# Patient Record
Sex: Female | Born: 1970 | Hispanic: Yes | Marital: Single | Smoking: Never smoker
Health system: Southern US, Community
[De-identification: ages and names within clinical notes are randomized; demographics above are authoritative.]

## PROBLEM LIST (undated history)

## (undated) DIAGNOSIS — E785 Hyperlipidemia, unspecified: Secondary | ICD-10-CM

## (undated) DIAGNOSIS — A048 Other specified bacterial intestinal infections: Secondary | ICD-10-CM

## (undated) DIAGNOSIS — E119 Type 2 diabetes mellitus without complications: Secondary | ICD-10-CM

## (undated) HISTORY — DX: Hyperlipidemia, unspecified: E78.5

## (undated) HISTORY — PX: TUBAL LIGATION: SHX77

## (undated) HISTORY — DX: Type 2 diabetes mellitus without complications: E11.9

## (undated) HISTORY — PX: CHOLECYSTECTOMY: SHX55

## (undated) HISTORY — DX: Other specified bacterial intestinal infections: A04.8

---

## 2008-02-23 ENCOUNTER — Other Ambulatory Visit: Admission: RE | Admit: 2008-02-23 | Discharge: 2008-02-23 | Payer: Self-pay | Admitting: Family Medicine

## 2008-02-23 ENCOUNTER — Other Ambulatory Visit: Admission: RE | Admit: 2008-02-23 | Discharge: 2008-02-23 | Payer: Self-pay | Admitting: Nurse Practitioner

## 2008-06-13 ENCOUNTER — Ambulatory Visit (HOSPITAL_COMMUNITY): Admission: RE | Admit: 2008-06-13 | Discharge: 2008-06-13 | Payer: Self-pay | Admitting: Obstetrics and Gynecology

## 2010-07-12 LAB — CBC
HCT: 35.9 % — ABNORMAL LOW (ref 36.0–46.0)
Hemoglobin: 12.6 g/dL (ref 12.0–15.0)
MCHC: 35 g/dL (ref 30.0–36.0)
MCV: 87.6 fL (ref 78.0–100.0)
Platelets: 260 10*3/uL (ref 150–400)
RBC: 4.1 MIL/uL (ref 3.87–5.11)
RDW: 13.8 % (ref 11.5–15.5)
WBC: 7.7 10*3/uL (ref 4.0–10.5)

## 2010-07-12 LAB — HCG, QUANTITATIVE, PREGNANCY: hCG, Beta Chain, Quant, S: <2 m[IU]/mL (ref <5)

## 2010-08-14 NOTE — H&P (Signed)
NAMEDAJIAH, KOOI         ACCOUNT NO.:  [id number]   MEDICAL RECORD NO.:  [id number]          PATIENT TYPE:  AMB   LOCATION:  DAY                           FACILITY:  [facility]   PHYSICIAN:  Tilda Burrow, M.D. DATE OF BIRTH:  [birth year]   DATE OF ADMISSION:  DATE OF DISCHARGE:  LH                              HISTORY & PHYSICAL   ADMISSION DIAGNOSES:  1. Desire for elective sterilization.2.  Umbilical hernia.   HISTORY OF PRESENT ILLNESS:  This 40 year old Hispanic female was  referred to our office at the courtesy of the Health Department for  elective permanent sterilization, covered by the [facility] funded  sterilization title 10 funds.  The patient has acknowledged her desire  for permanent sterilization and has signed tubal sterilization forms on  April 25, 2008, at [facility] Department.  She has come  to our office for evaluation and scheduling of surgery.  She has  confirmed her desire for permanent sterilization.  She acknowledges the  understanding of the procedure.  We have reviewed a [employer] instructional  booklet to review the tubal sterilization procedure.  Failure rates of  1:100 are quoted to the patient.   PRIOR GYN HISTORY:  Notable for colposcopic abnormalities.  She had a  Pap smear suggesting atypical squamous cells, suggesting high-grade  lesion in February 2008, with subsequent erosive cervicitis.  She was  treated with for Trichomonas vaginitis and the cervix remained inflamed.  The colposcopic biopsy showed no dysplasia and only inflammation.  Currently the abnormal Pap smears are being followed every 6 months,  until normal results are obtained or abnormalities identified that would  be removed.   PAST MEDICAL HISTORY:  Benign.   SURGICAL HISTORY:  Cholecystectomy in 2001.   ALLERGIES:  DENIED.   MEDICATIONS:  Denied.   PHYSICAL EXAMINATION:  VITAL SIGNS:  Height 5'1, weight 285.  GENERAL:  Showing a healthy-appearing obese  Hispanic female, alert and  oriented x3.  HEENT:  Pupils equal, round and reactive.  NECK:  Supple.  CHEST:  Clear to auscultation.  BREASTS:  Deferred.  ABDOMEN:  Obese with a small 2-cm umbilical hernia, reducible.  External  genitalia is normal female.  Vaginal exam normal.  Cervix at this time  is nonpurulent.  Uterus is mobile and nontender.  Exam limited by the  patient's body habitus.  Adnexa without discernible pathology.   ASSESSMENT:  1. Desire for elective permanent sterilization.  2. Small umbilical hernia.  3. History of abnormal Pap smears, being followed by Health      Department.  4. Morbid obesity.   PLAN:  Laparoscopic tubal sterilization with Falope rings with an  umbilical hernia performed at the same time as a mechanism of accessing  the abdomen, as well as improving patient condition.      Tilda Burrow, M.D.  Electronically Signed     JVF/MEDQ  D:  06/09/2008  T:  06/09/2008  Job:  [id number]   cc:   Health Department [facility]

## 2010-08-14 NOTE — Op Note (Signed)
Angelica Bentley, MACPHERSON         ACCOUNT NO.:  [id number]   MEDICAL RECORD NO.:  [id number]          PATIENT TYPE:  AMB   LOCATION:  DAY                           FACILITY:  [facility]   PHYSICIAN:  Tilda Burrow, M.D. DATE OF BIRTH:  March 26, 1971   DATE OF PROCEDURE:  06/13/2008  DATE OF DISCHARGE:                               OPERATIVE REPORT   PREOPERATIVE DIAGNOSES:  1. Elective sterilization.2.  Morbid obesity.3. UmbilicaL Hernia   POSTOPERATIVE DIAGNOSES:  1. Elective sterilization.2.  Morbid obesity.3/ Umbilical Hernia.   PROCEDURE:  Laparoscopic tubal sterilization, Falope-Ring, and umbilical  herniorrhaphy.   INDICATIONS:  A 40 year old female referred from [facility] Department for tubal sterilization.  The patient has been  abstinent since LMP.   FINDINGS:  Small umbilical hernia at the center of  umbilicus, possibly  related to prior laparoscopic cholecystectomy.   DETAILS OF PROCEDURE:  The patient was taken to the operating room,  prepped and draped for combined abdominal and vaginal procedure.  In-and-  out [facility] catheter was performed and then the cervix was grasped with  single-tooth tenaculum.  Hulka tenaculum applied and utilized later for  uterine manipulation.  An infraumbilical 2 cm skin incision was made  sharply with Allis clamps placed on skin edges and the umbilical hernia  identified just beneath the skin.  The hernia sac was peeled free from  the surrounding tissue and then opened at its apex.  There was omental  fat only in the small hernia, less than 1 cm in diameter.  The 5-mm  laparoscopic trocar could be inserted directly without difficulty or  resistance and then pneumoperitoneum was easily achieved.  Allis clamp  was placed on the adjacent tissues to maintain pneumoperitoneum.  The  abdomen was inspected.  There was a small amount of omental adhesions to  the umbilical hernia site, but otherwise there were no anterior  abdominal wall adhesions.  There was extensive intra-abdominal fat  making visualization initially challenging.  The patient was placed in  significant Trendelenburg position and suprapubic trocar placed under  direct visualization.  Attention was directed to the pelvis and after  some effort in manipulating the epiploic fat away, we were able to  identify the tubes bilaterally.  Falope-Rings were placed on the  proximal portion of each tube with good tissue capture in the ring  applier.  The incarcerated knuckle of tube was infiltrated  percutaneously by use of a spinal needle, instilling a couple of  milliliters of Marcaine solution on either side of the Falope-Ring.  The  procedure was then continued by removal of the umbilical trocar.  The  camera was placed in the suprapubic trocar, and the underside of the  umbilical hernia inspected.  There were some thin adhesions of omental  tissues to the hernia sac.  There was no bowel anywhere near the  umbilical hernia.  The hernia sac could be trimmed and released into the  abdomen.  There was good hemostasis.  The fascia was closed with 3  interrupted 2-0 Prolene sutures, which were tied in an interrupted  fashion.  Subcu  tissues were approximated  with 2 subcu 3-0 Vicryl sutures and then  subcuticular closure of the skin at both umbilical and suprapubic sites  performed.  Steri-Strips were placed, Band-Aids covering the Steri-  Strips and the patient went to recovery room in stable condition.  Sponge and needle counts were correct.      Tilda Burrow, M.D.  Electronically Signed     JVF/MEDQ  D:  06/13/2008  T:  06/14/2008  Job:  [id number]   cc:   Health Department [facility]  Fax: [phone]

## 2012-04-01 DIAGNOSIS — E119 Type 2 diabetes mellitus without complications: Secondary | ICD-10-CM

## 2012-04-01 DIAGNOSIS — A048 Other specified bacterial intestinal infections: Secondary | ICD-10-CM

## 2012-04-01 HISTORY — DX: Type 2 diabetes mellitus without complications: E11.9

## 2012-04-01 HISTORY — DX: Other specified bacterial intestinal infections: A04.8

## 2012-10-20 ENCOUNTER — Other Ambulatory Visit (HOSPITAL_COMMUNITY): Payer: Self-pay | Admitting: Physician Assistant

## 2012-10-20 DIAGNOSIS — Z139 Encounter for screening, unspecified: Secondary | ICD-10-CM

## 2012-10-27 ENCOUNTER — Ambulatory Visit (HOSPITAL_COMMUNITY): Payer: Self-pay

## 2012-11-03 ENCOUNTER — Ambulatory Visit (HOSPITAL_COMMUNITY)
Admission: RE | Admit: 2012-11-03 | Discharge: 2012-11-03 | Disposition: A | Discharge status: Home / Self Care | Payer: Self-pay | Source: Ambulatory Visit | Attending: Physician Assistant | Admitting: Physician Assistant

## 2012-11-03 DIAGNOSIS — Z139 Encounter for screening, unspecified: Secondary | ICD-10-CM

## 2013-09-30 ENCOUNTER — Other Ambulatory Visit (HOSPITAL_COMMUNITY): Payer: Self-pay | Admitting: Physician Assistant

## 2013-09-30 DIAGNOSIS — Z1231 Encounter for screening mammogram for malignant neoplasm of breast: Secondary | ICD-10-CM

## 2013-11-04 ENCOUNTER — Encounter (HOSPITAL_COMMUNITY): Payer: Self-pay

## 2013-11-08 ENCOUNTER — Ambulatory Visit (HOSPITAL_COMMUNITY)
Admission: RE | Admit: 2013-11-08 | Discharge: 2013-11-08 | Disposition: A | Discharge status: Home / Self Care | Payer: PRIVATE HEALTH INSURANCE | Source: Ambulatory Visit | Attending: Physician Assistant | Admitting: Physician Assistant

## 2013-11-08 DIAGNOSIS — Z1231 Encounter for screening mammogram for malignant neoplasm of breast: Secondary | ICD-10-CM

## 2013-11-12 ENCOUNTER — Other Ambulatory Visit: Payer: Self-pay | Admitting: Physician Assistant

## 2013-11-12 DIAGNOSIS — R928 Other abnormal and inconclusive findings on diagnostic imaging of breast: Secondary | ICD-10-CM

## 2013-12-02 ENCOUNTER — Other Ambulatory Visit (HOSPITAL_COMMUNITY): Payer: Self-pay | Admitting: *Deleted

## 2013-12-02 DIAGNOSIS — R928 Other abnormal and inconclusive findings on diagnostic imaging of breast: Secondary | ICD-10-CM

## 2013-12-14 ENCOUNTER — Ambulatory Visit (HOSPITAL_COMMUNITY)
Admission: RE | Admit: 2013-12-14 | Discharge: 2013-12-14 | Disposition: A | Discharge status: Home / Self Care | Payer: PRIVATE HEALTH INSURANCE | Source: Ambulatory Visit | Attending: *Deleted | Admitting: *Deleted

## 2013-12-14 DIAGNOSIS — R928 Other abnormal and inconclusive findings on diagnostic imaging of breast: Secondary | ICD-10-CM | POA: Insufficient documentation

## 2015-01-18 ENCOUNTER — Other Ambulatory Visit: Payer: Self-pay | Admitting: Physician Assistant

## 2015-01-18 DIAGNOSIS — Z1231 Encounter for screening mammogram for malignant neoplasm of breast: Secondary | ICD-10-CM

## 2015-01-25 ENCOUNTER — Ambulatory Visit (HOSPITAL_COMMUNITY)
Admission: RE | Admit: 2015-01-25 | Discharge: 2015-01-25 | Disposition: A | Discharge status: Home / Self Care | Payer: PRIVATE HEALTH INSURANCE | Source: Ambulatory Visit | Attending: Physician Assistant | Admitting: Physician Assistant

## 2015-01-25 DIAGNOSIS — Z1231 Encounter for screening mammogram for malignant neoplasm of breast: Secondary | ICD-10-CM

## 2015-02-21 ENCOUNTER — Ambulatory Visit: Payer: Self-pay | Admitting: Physician Assistant

## 2015-02-21 ENCOUNTER — Encounter: Payer: Self-pay | Admitting: Physician Assistant

## 2015-02-21 VITALS — BP 138/80 | HR 93 | Temp 98.1°F | Ht 62.5 in | Wt 262.0 lb

## 2015-02-21 DIAGNOSIS — R300 Dysuria: Secondary | ICD-10-CM

## 2015-02-21 DIAGNOSIS — N3090 Cystitis, unspecified without hematuria: Secondary | ICD-10-CM

## 2015-02-21 LAB — POCT URINALYSIS DIPSTICK
Bilirubin, UA: NEGATIVE
Ketones, UA: NEGATIVE
LEUKOCYTES UA: NEGATIVE
Protein, UA: >=300
Spec Grav, UA: >=1.03
UROBILINOGEN UA: 1
pH, UA: 5.5

## 2015-02-21 MED ORDER — LISINOPRIL 5 MG PO TABS: 5.0000 mg | ORAL_TABLET | Freq: Every day | ORAL | Status: DC

## 2015-02-21 MED ORDER — CIPROFLOXACIN HCL 500 MG PO TABS: 500.0000 mg | ORAL_TABLET | Freq: Two times a day (BID) | ORAL | Status: DC

## 2015-02-21 NOTE — Progress Notes (Signed)
BP 138/80 mmHg  Pulse 93  Temp(Src) 98.1 F (36.7 C)  Ht 5' 2.5" (1.588 m)  Wt 262 lb (118.842 kg)  BMI 47.13 kg/m2  SpO2 99%  LMP 01/14/2015   Subjective:    Patient ID: Angelica Bentley, female    DOB: 05/21/1970, 44 y.o.   MRN: [id number]  HPI: Angelica Bentley is a 44 y.o. female presenting on 02/21/2015 for Dysuria   HPI  symtpoms started lasted Monday, eight days ago with frequency.  Burning started Friday.  She felt feverish Saturday and Sunday but she didn't actually check her temperature.    Relevant past medical, surgical, family and social history reviewed and updated as indicated. Interim medical history since our last visit reviewed. Allergies and medications reviewed and updated.  Current outpatient prescriptions:  .  fenofibrate micronized (LOFIBRA) 134 MG capsule, Take 134 mg by mouth daily., Disp: , Rfl:  .  glipiZIDE (GLUCOTROL) 10 MG tablet, Take 10 mg by mouth 2 (two) times daily., Disp: , Rfl:  .  insulin NPH-regular Human (NOVOLIN 70/30) (70-30) 100 UNIT/ML injection, Inject 30 Units into the skin 2 (two) times daily., Disp: , Rfl:  .  lisinopril (PRINIVIL,ZESTRIL) 5 MG tablet, Take 5 mg by mouth daily., Disp: , Rfl:  .  metFORMIN (GLUCOPHAGE) 1000 MG tablet, Take 1,000 mg by mouth 2 (two) times daily with a meal., Disp: , Rfl:  .  Omega-3 Fatty Acids (FISH OIL PO), Take by mouth. 4 cap daily, Disp: , Rfl:  .  simvastatin (ZOCOR) 20 MG tablet, Take 20 mg by mouth at bedtime., Disp: , Rfl:    Review of Systems  Constitutional: Positive for fever and chills. Negative for diaphoresis, appetite change, fatigue and unexpected weight change.  HENT: Positive for dental problem, hearing loss and mouth sores. Negative for congestion, drooling, ear pain, facial swelling, sneezing, sore throat, trouble swallowing and voice change.   Eyes: Negative for pain, discharge, redness, itching and visual disturbance.  Respiratory: Negative for cough, choking, shortness  of breath and wheezing.   Cardiovascular: Negative for chest pain, palpitations and leg swelling.  Gastrointestinal: Negative for vomiting, abdominal pain, diarrhea, constipation and blood in stool.  Endocrine: Negative for cold intolerance, heat intolerance and polydipsia.  Genitourinary: Positive for dysuria. Negative for hematuria and decreased urine volume.  Musculoskeletal: Positive for back pain. Negative for arthralgias and gait problem.  Skin: Negative for rash.  Allergic/Immunologic: Negative for environmental allergies.  Neurological: Positive for headaches. Negative for seizures, syncope and light-headedness.  Hematological: Negative for adenopathy.  Psychiatric/Behavioral: Negative for suicidal ideas, dysphoric mood and agitation. The patient is not nervous/anxious.     Per HPI unless specifically indicated above     Objective:    BP 138/80 mmHg  Pulse 93  Temp(Src) 98.1 F (36.7 C)  Ht 5' 2.5" (1.588 m)  Wt 262 lb (118.842 kg)  BMI 47.13 kg/m2  SpO2 99%  LMP 01/14/2015  Wt Readings from Last 3 Encounters:  02/21/15 262 lb (118.842 kg)    Physical Exam  Constitutional: She is oriented to person, place, and time. She appears well-developed and well-nourished.  HENT:  Head: Normocephalic and atraumatic.  Neck: Neck supple.  Cardiovascular: Normal rate and regular rhythm.   Pulmonary/Chest: Effort normal and breath sounds normal.  Abdominal: Soft. Bowel sounds are normal. She exhibits no mass. There is no tenderness. There is no CVA tenderness.  Lymphadenopathy:    She has no cervical adenopathy.  Neurological: She is alert and oriented to person,  place, and time.  Skin: Skin is warm and dry.  Psychiatric: She has a normal mood and affect. Her behavior is normal.  Vitals reviewed.   Results for orders placed or performed in visit on 02/21/15  POCT Urinalysis Dipstick  Result Value Ref Range   Color, UA orange    Clarity, UA cloudy    Glucose, UA >=1000     Bilirubin, UA N    Ketones, UA N    Spec Grav, UA >=1.030    Blood, UA SMALL    pH, UA 5.5    Protein, UA >=300    Urobilinogen, UA 1.0    Nitrite, UA P    Leukocytes, UA Negative Negative      Assessment & Plan:   Encounter Diagnoses  Name Primary?  . Dysuria Yes  . Cystitis      rx cipro F/u as scheduled. rto if symptoms worsen or persist

## 2015-02-22 ENCOUNTER — Other Ambulatory Visit: Payer: Self-pay | Admitting: Physician Assistant

## 2015-02-22 LAB — HEMOGLOBIN A1C
Hgb A1c MFr Bld: 9.8 % — ABNORMAL HIGH (ref <5.7)
MEAN PLASMA GLUCOSE: 235 mg/dL — AB (ref <117)

## 2015-02-28 ENCOUNTER — Encounter: Payer: Self-pay | Admitting: Physician Assistant

## 2015-02-28 ENCOUNTER — Ambulatory Visit: Payer: Self-pay | Admitting: Physician Assistant

## 2015-02-28 VITALS — BP 126/72 | HR 84 | Temp 97.7°F | Ht 62.5 in | Wt 262.2 lb

## 2015-02-28 DIAGNOSIS — E1165 Type 2 diabetes mellitus with hyperglycemia: Secondary | ICD-10-CM

## 2015-02-28 DIAGNOSIS — E785 Hyperlipidemia, unspecified: Secondary | ICD-10-CM

## 2015-02-28 DIAGNOSIS — E118 Type 2 diabetes mellitus with unspecified complications: Principal | ICD-10-CM

## 2015-02-28 MED ORDER — INSULIN NPH ISOPHANE & REGULAR (70-30) 100 UNIT/ML ~~LOC~~ SUSP: 33.0000 [IU] | Freq: Two times a day (BID) | SUBCUTANEOUS | Status: DC

## 2015-02-28 NOTE — Progress Notes (Signed)
BP 126/72 mmHg  Pulse 84  Temp(Src) 97.7 F (36.5 C)  Ht 5' 2.5" (1.588 m)  Wt 262 lb 3.2 oz (118.933 kg)  BMI 47.16 kg/m2  SpO2 98%   Subjective:    Patient ID: Angelica Bentley, female    DOB: 09-29-1970, 44 y.o.   MRN: [id number]  HPI: Angelica Bentley is a 44 y.o. female presenting on 02/28/2015 for Follow-up and Diabetes   HPI Pt has been checking her bs at home.  BS log reviewed- still too high At last regular OV, gave pt [employer] discount app so she could go to lipid cliic  Relevant past medical, surgical, family and social history reviewed and updated as indicated. Interim medical history since our last visit reviewed. Allergies and medications reviewed and updated.  Current outpatient prescriptions:  .  fenofibrate micronized (LOFIBRA) 134 MG capsule, Take 134 mg by mouth daily., Disp: , Rfl:  .  glipiZIDE (GLUCOTROL) 10 MG tablet, Take 10 mg by mouth 2 (two) times daily., Disp: , Rfl:  .  insulin NPH-regular Human (NOVOLIN 70/30) (70-30) 100 UNIT/ML injection, Inject 32 Units into the skin 2 (two) times daily. , Disp: , Rfl:  .  lisinopril (PRINIVIL,ZESTRIL) 5 MG tablet, Take 1 tablet (5 mg total) by mouth daily. Tome una tableta por boca diaria, Disp: 30 tablet, Rfl: 3 .  metFORMIN (GLUCOPHAGE) 1000 MG tablet, Take 1,000 mg by mouth 2 (two) times daily with a meal., Disp: , Rfl:  .  Omega-3 Fatty Acids (FISH OIL PO), Take by mouth. 4 cap daily, Disp: , Rfl:  .  simvastatin (ZOCOR) 20 MG tablet, Take 20 mg by mouth at bedtime., Disp: , Rfl:    Review of Systems  Constitutional: Negative for fever, chills, diaphoresis, appetite change, fatigue and unexpected weight change.  HENT: Positive for dental problem. Negative for congestion, drooling, ear pain, facial swelling, hearing loss, mouth sores, sneezing, sore throat, trouble swallowing and voice change.   Eyes: Negative for pain, discharge, redness, itching and visual disturbance.  Respiratory: Negative for cough,  choking, shortness of breath and wheezing.   Cardiovascular: Negative for chest pain, palpitations and leg swelling.  Gastrointestinal: Negative for vomiting, abdominal pain, diarrhea, constipation and blood in stool.  Endocrine: Negative for cold intolerance, heat intolerance and polydipsia.  Genitourinary: Negative for dysuria, hematuria and decreased urine volume.  Musculoskeletal: Negative for back pain, arthralgias and gait problem.  Skin: Negative for rash.  Allergic/Immunologic: Negative for environmental allergies.  Neurological: Negative for seizures, syncope, light-headedness and headaches.  Hematological: Negative for adenopathy.  Psychiatric/Behavioral: Negative for suicidal ideas, dysphoric mood and agitation. The patient is not nervous/anxious.     Per HPI unless specifically indicated above     Objective:    BP 126/72 mmHg  Pulse 84  Temp(Src) 97.7 F (36.5 C)  Ht 5' 2.5" (1.588 m)  Wt 262 lb 3.2 oz (118.933 kg)  BMI 47.16 kg/m2  SpO2 98%  Wt Readings from Last 3 Encounters:  02/28/15 262 lb 3.2 oz (118.933 kg)  02/21/15 262 lb (118.842 kg)    Physical Exam  Constitutional: She is oriented to person, place, and time. She appears well-developed and well-nourished.  HENT:  Head: Normocephalic and atraumatic.  Neck: Neck supple.  Cardiovascular: Normal rate and regular rhythm.   Pulmonary/Chest: Effort normal and breath sounds normal.  Abdominal: Soft. Bowel sounds are normal. She exhibits no mass. There is no tenderness.  obese  Musculoskeletal: She exhibits no edema.  Lymphadenopathy:    She  has no cervical adenopathy.  Neurological: She is alert and oriented to person, place, and time.  Skin: Skin is warm and dry.  Psychiatric: She has a normal mood and affect. Her behavior is normal.  Vitals reviewed.     Results for orders placed or performed in visit on 02/22/15  Hemoglobin A1c  Result Value Ref Range   Hgb A1c MFr Bld 9.8 (H) <5.7 %   Mean  Plasma Glucose 235 (H) <117 mg/dL      Assessment & Plan:   Encounter Diagnoses  Name Primary?  Marland Kitchen. Uncontrolled type 2 diabetes mellitus with complication, unspecified long term insulin use status (HCC) Yes  . Hyperlipemia   . Morbid obesity, unspecified obesity type (HCC)     -Re-refer to lipid clinic (last labs drawn 11/16/14) -Has  DM eye exam appt in juanuary -Increase insulin 1 u qod if am fbs > 120 -F/u with bs log in one month

## 2015-03-02 DIAGNOSIS — E785 Hyperlipidemia, unspecified: Secondary | ICD-10-CM | POA: Insufficient documentation

## 2015-03-02 DIAGNOSIS — E118 Type 2 diabetes mellitus with unspecified complications: Secondary | ICD-10-CM

## 2015-03-02 DIAGNOSIS — IMO0002 Reserved for concepts with insufficient information to code with codable children: Secondary | ICD-10-CM | POA: Insufficient documentation

## 2015-03-02 DIAGNOSIS — E1165 Type 2 diabetes mellitus with hyperglycemia: Secondary | ICD-10-CM | POA: Insufficient documentation

## 2015-03-29 ENCOUNTER — Ambulatory Visit: Payer: Self-pay | Admitting: Physician Assistant

## 2015-03-29 ENCOUNTER — Encounter: Payer: Self-pay | Admitting: Physician Assistant

## 2015-03-29 VITALS — BP 120/66 | HR 86 | Temp 97.9°F | Ht 62.5 in | Wt 256.8 lb

## 2015-03-29 DIAGNOSIS — E1165 Type 2 diabetes mellitus with hyperglycemia: Principal | ICD-10-CM

## 2015-03-29 DIAGNOSIS — E785 Hyperlipidemia, unspecified: Secondary | ICD-10-CM

## 2015-03-29 DIAGNOSIS — IMO0001 Reserved for inherently not codable concepts without codable children: Secondary | ICD-10-CM

## 2015-03-29 NOTE — Progress Notes (Signed)
BP 120/66 mmHg  Pulse 86  Temp(Src) 97.9 F (36.6 C)  Ht 5' 2.5" (1.588 m)  Wt 256 lb 12.8 oz (116.484 kg)  BMI 46.19 kg/m2  SpO2 99%   Subjective:    Patient ID: Angelica Bentley, female    DOB: Oct 09, 1970, 44 y.o.   MRN: [id number]  HPI: Angelica Bentley is a 44 y.o. female presenting on 03/29/2015 for Diabetes   HPI   Reviewed bs log. Pt states she only eats bid- at 10am and 5pm.  She does this b/c it is how she can manage things with her work.  Pt increased her insulin as instructed at previous OV but then went back down to 33 units for some reason.  Relevant past medical, surgical, family and social history reviewed and updated as indicated. Interim medical history since our last visit reviewed. Allergies and medications reviewed and updated.  Current outpatient prescriptions:  .  fenofibrate micronized (LOFIBRA) 134 MG capsule, Take 134 mg by mouth daily., Disp: , Rfl:  .  glipiZIDE (GLUCOTROL) 10 MG tablet, Take 10 mg by mouth 2 (two) times daily., Disp: , Rfl:  .  insulin NPH-regular Human (NOVOLIN 70/30) (70-30) 100 UNIT/ML injection, Inject 33 Units into the skin 2 (two) times daily with a meal. inyecte 33 unidades a la piel 2 (dos) veces diarias con comida, Disp: 10 mL, Rfl: 11 .  lisinopril (PRINIVIL,ZESTRIL) 5 MG tablet, Take 1 tablet (5 mg total) by mouth daily. Tome una tableta por boca diaria, Disp: 30 tablet, Rfl: 3 .  metFORMIN (GLUCOPHAGE) 1000 MG tablet, Take 1,000 mg by mouth 2 (two) times daily with a meal., Disp: , Rfl:  .  Omega-3 Fatty Acids (FISH OIL PO), Take by mouth. 4 cap daily, Disp: , Rfl:  .  simvastatin (ZOCOR) 20 MG tablet, Take 20 mg by mouth at bedtime., Disp: , Rfl:    Review of Systems  Constitutional: Negative for fever, chills, diaphoresis, appetite change, fatigue and unexpected weight change.  HENT: Positive for dental problem. Negative for congestion, drooling, ear pain, facial swelling, hearing loss, mouth sores, sneezing, sore  throat, trouble swallowing and voice change.   Eyes: Negative for pain, discharge, redness, itching and visual disturbance.  Respiratory: Negative for cough, choking, shortness of breath and wheezing.   Cardiovascular: Negative for chest pain, palpitations and leg swelling.  Gastrointestinal: Negative for vomiting, abdominal pain, diarrhea, constipation and blood in stool.  Endocrine: Negative for cold intolerance, heat intolerance and polydipsia.  Genitourinary: Negative for dysuria, hematuria and decreased urine volume.  Musculoskeletal: Negative for back pain, arthralgias and gait problem.  Skin: Negative for rash.  Allergic/Immunologic: Negative for environmental allergies.  Neurological: Negative for seizures, syncope, light-headedness and headaches.  Hematological: Negative for adenopathy.  Psychiatric/Behavioral: Negative for suicidal ideas, dysphoric mood and agitation. The patient is not nervous/anxious.     Per HPI unless specifically indicated above     Objective:    BP 120/66 mmHg  Pulse 86  Temp(Src) 97.9 F (36.6 C)  Ht 5' 2.5" (1.588 m)  Wt 256 lb 12.8 oz (116.484 kg)  BMI 46.19 kg/m2  SpO2 99%  Wt Readings from Last 3 Encounters:  03/29/15 256 lb 12.8 oz (116.484 kg)  02/28/15 262 lb 3.2 oz (118.933 kg)  02/21/15 262 lb (118.842 kg)    Physical Exam  Constitutional: She is oriented to person, place, and time. She appears well-developed and well-nourished.  HENT:  Head: Normocephalic and atraumatic.  Neck: Neck supple.  Cardiovascular: Normal rate  and regular rhythm.   Pulmonary/Chest: Effort normal and breath sounds normal.  Musculoskeletal: She exhibits no edema.  Lymphadenopathy:    She has no cervical adenopathy.  Neurological: She is alert and oriented to person, place, and time.  Skin: Skin is warm and dry.  Psychiatric: She has a normal mood and affect. Her behavior is normal.  Vitals reviewed.   Results for orders placed or performed in visit on  02/22/15  Hemoglobin A1c  Result Value Ref Range   Hgb A1c MFr Bld 9.8 (H) <5.7 %   Mean Plasma Glucose 235 (H) <117 mg/dL      Assessment & Plan:    Encounter Diagnoses  Name Primary?  Marland Kitchen. Uncontrolled diabetes mellitus type 2 without complications, unspecified long term insulin use status (HCC) Yes  . Hyperlipidemia   . Morbid obesity, unspecified obesity type (HCC)    -Pt counseled to increase insulin to 35u bid.  She is to notify office if bs goes below 100.  -f/u OV 2 mo. rto sooner prn

## 2015-05-23 ENCOUNTER — Other Ambulatory Visit: Payer: Self-pay

## 2015-05-23 DIAGNOSIS — IMO0001 Reserved for inherently not codable concepts without codable children: Secondary | ICD-10-CM

## 2015-05-23 DIAGNOSIS — E785 Hyperlipidemia, unspecified: Secondary | ICD-10-CM

## 2015-05-23 DIAGNOSIS — E1165 Type 2 diabetes mellitus with hyperglycemia: Principal | ICD-10-CM

## 2015-05-27 LAB — COMPLETE METABOLIC PANEL WITH GFR
ALBUMIN: 3.7 g/dL (ref 3.6–5.1)
ALK PHOS: 99 U/L (ref 33–115)
ALT: 13 U/L (ref 6–29)
AST: 11 U/L (ref 10–35)
BILIRUBIN TOTAL: 0.9 mg/dL (ref 0.2–1.2)
BUN: 5 mg/dL — AB (ref 7–25)
CALCIUM: 8.7 mg/dL (ref 8.6–10.2)
CO2: 22 mmol/L (ref 20–31)
Chloride: 102 mmol/L (ref 98–110)
Creat: 0.49 mg/dL — ABNORMAL LOW (ref 0.50–1.10)
GFR, Est African American: >89 mL/min (ref >=60)
GLUCOSE: 208 mg/dL — AB (ref 65–99)
POTASSIUM: 4.1 mmol/L (ref 3.5–5.3)
SODIUM: 135 mmol/L (ref 135–146)
TOTAL PROTEIN: 7 g/dL (ref 6.1–8.1)

## 2015-05-27 LAB — LIPID PANEL
CHOL/HDL RATIO: 5.4 ratio — AB (ref <=5.0)
CHOLESTEROL: 188 mg/dL (ref 125–200)
HDL: 35 mg/dL — ABNORMAL LOW (ref >=46)
Triglycerides: 437 mg/dL — ABNORMAL HIGH (ref <150)

## 2015-05-27 LAB — HEMOGLOBIN A1C
Hgb A1c MFr Bld: 9.7 % — ABNORMAL HIGH (ref <5.7)
Mean Plasma Glucose: 232 mg/dL — ABNORMAL HIGH (ref <117)

## 2015-05-27 LAB — MICROALBUMIN, URINE: MICROALB UR: 33 mg/dL

## 2015-05-30 ENCOUNTER — Encounter: Payer: Self-pay | Admitting: Physician Assistant

## 2015-05-30 ENCOUNTER — Ambulatory Visit: Payer: Self-pay | Admitting: Physician Assistant

## 2015-05-30 VITALS — BP 122/70 | HR 84 | Temp 98.1°F | Ht 62.5 in | Wt 251.0 lb

## 2015-05-30 DIAGNOSIS — E785 Hyperlipidemia, unspecified: Secondary | ICD-10-CM

## 2015-05-30 DIAGNOSIS — E118 Type 2 diabetes mellitus with unspecified complications: Principal | ICD-10-CM

## 2015-05-30 DIAGNOSIS — E1165 Type 2 diabetes mellitus with hyperglycemia: Secondary | ICD-10-CM

## 2015-05-30 NOTE — Patient Instructions (Addendum)
Continue current medications.  Continue medicamentos actuales Start taking a baby aspirin daily.  Empieze a tomar una aspirina de bebe diario

## 2015-05-30 NOTE — Progress Notes (Signed)
BP 122/70 mmHg  Pulse 84  Temp(Src) 98.1 F (36.7 C)  Ht 5' 2.5" (1.588 m)  Wt 251 lb (113.853 kg)  BMI 45.15 kg/m2  SpO2 98%   Subjective:    Patient ID: Angelica Bentley, female    DOB: 09-29-1970, 45 y.o.   MRN: [id number]  HPI: Angelica Bentley is a 45 y.o. female presenting on 05/30/2015 for Diabetes   HPI   Pt working at [employer]. 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  Pt has bs log.  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  Relevant past medical, surgical, family and social history reviewed and updated as indicated. Interim medical history since our last visit reviewed. Allergies and medications reviewed and updated.   Current outpatient prescriptions:  .  fenofibrate micronized (LOFIBRA) 134 MG capsule, Take 134 mg by mouth daily., Disp: , Rfl:  .  glipiZIDE (GLUCOTROL) 10 MG tablet, Take 10 mg by mouth 2 (two) times daily., Disp: , Rfl:  .  insulin NPH-regular Human (NOVOLIN 70/30) (70-30) 100 UNIT/ML injection, Inject 33 Units into the skin 2 (two) times daily with a meal. inyecte 33 unidades a la piel 2 (dos) veces diarias con comida (Patient taking differently: Inject 35 Units into the skin 2 (two) times daily with a meal. inyecte 33 unidades a la piel 2 (dos) veces diarias con comida), Disp: 10 mL, Rfl: 11 .  lisinopril (PRINIVIL,ZESTRIL) 5 MG tablet, Take 1 tablet (5 mg total) by mouth daily. Tome una tableta por boca diaria, Disp: 30 tablet, Rfl: 3 .  metFORMIN (GLUCOPHAGE) 1000 MG tablet, Take 1,000 mg by mouth 2 (two) times daily with a meal., Disp: , Rfl:  .  Omega-3 Fatty Acids (FISH OIL PO), Take by mouth. 4 cap daily, Disp: , Rfl:  .  simvastatin (ZOCOR) 20 MG tablet, Take 20 mg by mouth at bedtime., Disp: , Rfl:    Review of Systems  Constitutional: Negative for fever, chills, diaphoresis, appetite change, fatigue and unexpected weight change.  HENT: Positive for dental problem. Negative for congestion, drooling, ear pain, facial swelling, hearing loss, mouth  sores, sneezing, sore throat, trouble swallowing and voice change.   Eyes: Negative for pain, discharge, redness, itching and visual disturbance.  Respiratory: Positive for cough. Negative for choking, shortness of breath and wheezing.   Cardiovascular: Negative for chest pain, palpitations and leg swelling.  Gastrointestinal: Negative for vomiting, abdominal pain, diarrhea, constipation and blood in stool.  Endocrine: Negative for cold intolerance, heat intolerance and polydipsia.  Genitourinary: Negative for dysuria, hematuria and decreased urine volume.  Musculoskeletal: Negative for back pain, arthralgias and gait problem.  Skin: Negative for rash.  Allergic/Immunologic: Negative for environmental allergies.  Neurological: Negative for seizures, syncope, light-headedness and headaches.  Hematological: Negative for adenopathy.  Psychiatric/Behavioral: Negative for suicidal ideas, dysphoric mood and agitation. The patient is not nervous/anxious.     Per HPI unless specifically indicated above     Objective:    BP 122/70 mmHg  Pulse 84  Temp(Src) 98.1 F (36.7 C)  Ht 5' 2.5" (1.588 m)  Wt 251 lb (113.853 kg)  BMI 45.15 kg/m2  SpO2 98%  Wt Readings from Last 3 Encounters:  05/30/15 251 lb (113.853 kg)  03/29/15 256 lb 12.8 oz (116.484 kg)  02/28/15 262 lb 3.2 oz (118.933 kg)    Physical Exam  Constitutional: She is oriented to person, place, and time. She appears well-developed and well-nourished.  HENT:  Head: Normocephalic and atraumatic.  Pt with her usual asymmetrical  smile and sagging L eye  Neck: Neck supple.  Cardiovascular: Normal rate and regular rhythm.   Pulmonary/Chest: Effort normal and breath sounds normal.  Abdominal: Soft. Bowel sounds are normal. She exhibits no mass. There is no hepatosplenomegaly. There is no tenderness.  Musculoskeletal: She exhibits no edema.  Lymphadenopathy:    She has no cervical adenopathy.  Neurological: She is alert and oriented  to person, place, and time.  Skin: Skin is warm and dry.  Psychiatric: She has a normal mood and affect. Her behavior is normal.  Vitals reviewed.   Results for orders placed or performed in visit on 05/23/15  COMPLETE METABOLIC PANEL WITH GFR  Result Value Ref Range   Sodium 135 135 - 146 mmol/L   Potassium 4.1 3.5 - 5.3 mmol/L   Chloride 102 98 - 110 mmol/L   CO2 22 20 - 31 mmol/L   Glucose, Bld 208 (H) 65 - 99 mg/dL   BUN 5 (L) 7 - 25 mg/dL   Creat 1.61 (L) 0.96 - 1.10 mg/dL   Total Bilirubin 0.9 0.2 - 1.2 mg/dL   Alkaline Phosphatase 99 33 - 115 U/L   AST 11 10 - 35 U/L   ALT 13 6 - 29 U/L   Total Protein 7.0 6.1 - 8.1 g/dL   Albumin 3.7 3.6 - 5.1 g/dL   Calcium 8.7 8.6 - 04.5 mg/dL   GFR, Est African American >89 >=60 mL/min   GFR, Est Non African American >89 >=60 mL/min  Microalbumin, urine  Result Value Ref Range   Microalb, Ur 33.0 Not estab mg/dL  HgB W0J  Result Value Ref Range   Hgb A1c MFr Bld 9.7 (H) <5.7 %   Mean Plasma Glucose 232 (H) <117 mg/dL  Lipid Profile  Result Value Ref Range   Cholesterol 188 125 - 200 mg/dL   Triglycerides 811 (H) <150 mg/dL   HDL 35 (L) >=91 mg/dL   Total CHOL/HDL Ratio 5.4 (H) <=5.0 Ratio   VLDL NOT CALC <30 mg/dL   LDL Cholesterol NOT CALC <130 mg/dL      Assessment & Plan:   Encounter Diagnoses  Name Primary?  Marland Kitchen Uncontrolled type 2 diabetes mellitus with complication, unspecified long term insulin use status (HCC) Yes  . Hyperlipidemia   . Morbid obesity, unspecified obesity type (HCC)     -reviewed labs with pt -pt has appointment for [facility] eye exam- in march 13 -Pt has referral for lipid clinic- awaiting appointment -a1c still way too high- pt's  bs log not in agreement with a1c level.  No medication changes today.  Refer to endocrine.    Pt counseled to take bs log with her to see the endocrinologist -Pt denies hx cva, but with exam in addition to uncontrolled lipids and dm, add baby aspirin qd -no other medication  changes today -f/u 3 months.  RTO sooner prn

## 2015-06-12 LAB — HM DIABETES EYE EXAM

## 2015-07-06 ENCOUNTER — Ambulatory Visit: Payer: PRIVATE HEALTH INSURANCE | Admitting: Cardiovascular Disease

## 2015-07-10 ENCOUNTER — Encounter: Payer: Self-pay | Admitting: *Deleted

## 2015-07-10 ENCOUNTER — Encounter: Payer: Self-pay | Admitting: Cardiovascular Disease

## 2015-07-11 ENCOUNTER — Encounter: Payer: Self-pay | Admitting: Physician Assistant

## 2015-07-11 ENCOUNTER — Other Ambulatory Visit: Payer: Self-pay | Admitting: Physician Assistant

## 2015-07-11 ENCOUNTER — Ambulatory Visit: Payer: Self-pay | Admitting: Physician Assistant

## 2015-07-11 VITALS — BP 124/64 | HR 106 | Temp 98.2°F | Ht 62.5 in | Wt 252.5 lb

## 2015-07-11 DIAGNOSIS — E785 Hyperlipidemia, unspecified: Secondary | ICD-10-CM

## 2015-07-11 DIAGNOSIS — K529 Noninfective gastroenteritis and colitis, unspecified: Secondary | ICD-10-CM

## 2015-07-11 DIAGNOSIS — E1165 Type 2 diabetes mellitus with hyperglycemia: Secondary | ICD-10-CM

## 2015-07-11 DIAGNOSIS — E118 Type 2 diabetes mellitus with unspecified complications: Secondary | ICD-10-CM

## 2015-07-11 MED ORDER — PROMETHAZINE HCL 25 MG PO TABS: ORAL_TABLET | ORAL | Status: DC

## 2015-07-11 NOTE — Progress Notes (Signed)
BP 124/64 mmHg  Pulse 106  Temp(Src) 98.2 F (36.8 C)  Ht 5' 2.5" (1.588 m)  Wt 252 lb 8 oz (114.533 kg)  BMI 45.42 kg/m2  SpO2 99%   Subjective:    Patient ID: Angelica Bentley, female    DOB: [birth year], 45 y.o.   MRN: [id number]  HPI: Angelica Bentley is a 45 y.o. female presenting on 07/11/2015 for Abdominal Pain   HPI   Chief Complaint  Patient presents with  . Abdominal Pain    began yesterday. has had vomiting and diarrhea. has drunk chamomile tea and was helpful     Pt has appointment tomorrow with endocrine for uncontrolled DM.  She is reminded to take her bs log with her- she is using 35 units insulin bid  She went to eye doctor in march  Pt has still not been scheduled with lipid clinic  Pt started with stomach pain about 6pm last night.  States it was not long after eating the zucchini.    No emesis since 7am today.  Last diarrhea about 8am.   She ate some oatmeal today and is drinking water without difficulty.  Still with nausea.  Also drinking tea.    Relevant past medical, surgical, family and social history reviewed and updated as indicated. Interim medical history since our last visit reviewed. Allergies and medications reviewed and updated.   Current outpatient prescriptions:  .  aspirin 81 MG tablet, Take 81 mg by mouth daily., Disp: , Rfl:  .  fenofibrate micronized (LOFIBRA) 134 MG capsule, Take 134 mg by mouth daily., Disp: , Rfl:  .  glipiZIDE (GLUCOTROL) 10 MG tablet, Take 10 mg by mouth 2 (two) times daily., Disp: , Rfl:  .  insulin NPH-regular Human (NOVOLIN 70/30) (70-30) 100 UNIT/ML injection, Inject 33 Units into the skin 2 (two) times daily with a meal. inyecte 33 unidades a la piel 2 (dos) veces diarias con comida (Patient taking differently: Inject 35 Units into the skin 2 (two) times daily with a meal. inyecte 33 unidades a la piel 2 (dos) veces diarias con comida), Disp: 10 mL, Rfl: 11 .  lisinopril (PRINIVIL,ZESTRIL) 5 MG tablet,  Take 1 tablet (5 mg total) by mouth daily. Tome una tableta por boca diaria, Disp: 30 tablet, Rfl: 3 .  metFORMIN (GLUCOPHAGE) 1000 MG tablet, Take 1,000 mg by mouth 2 (two) times daily with a meal., Disp: , Rfl:  .  Omega-3 Fatty Acids (FISH OIL PO), Take by mouth. 4 cap daily, Disp: , Rfl:  .  simvastatin (ZOCOR) 20 MG tablet, Take 20 mg by mouth at bedtime., Disp: , Rfl:    Review of Systems  Constitutional: Positive for appetite change. Negative for fever, chills, diaphoresis, fatigue and unexpected weight change.  HENT: Positive for dental problem. Negative for congestion, drooling, ear pain, facial swelling, hearing loss, mouth sores, sneezing, sore throat, trouble swallowing and voice change.   Eyes: Negative for pain, discharge, redness, itching and visual disturbance.  Respiratory: Negative for cough, choking, shortness of breath and wheezing.   Cardiovascular: Negative for chest pain, palpitations and leg swelling.  Gastrointestinal: Positive for vomiting and diarrhea. Negative for abdominal pain, constipation and blood in stool.  Endocrine: Negative for cold intolerance, heat intolerance and polydipsia.  Genitourinary: Negative for dysuria, hematuria and decreased urine volume.  Musculoskeletal: Negative for back pain, arthralgias and gait problem.  Skin: Negative for rash.  Allergic/Immunologic: Negative for environmental allergies.  Neurological: Positive for headaches. Negative for seizures, syncope and  light-headedness.  Hematological: Negative for adenopathy.  Psychiatric/Behavioral: Negative for suicidal ideas, dysphoric mood and agitation. The patient is not nervous/anxious.     Per HPI unless specifically indicated above     Objective:    BP 124/64 mmHg  Pulse 106  Temp(Src) 98.2 F (36.8 C)  Ht 5' 2.5" (1.588 m)  Wt 252 lb 8 oz (114.533 kg)  BMI 45.42 kg/m2  SpO2 99%  Wt Readings from Last 3 Encounters:  07/11/15 252 lb 8 oz (114.533 kg)  05/30/15 251 lb  (113.853 kg)  03/29/15 256 lb 12.8 oz (116.484 kg)    Physical Exam  Constitutional: She is oriented to person, place, and time. She appears well-developed and well-nourished.  HENT:  Head: Normocephalic and atraumatic.  Neck: Neck supple.  Cardiovascular: Normal rate and regular rhythm.   Pulmonary/Chest: Effort normal and breath sounds normal.  Abdominal: Soft. Bowel sounds are normal. She exhibits no mass. There is no hepatosplenomegaly. There is no tenderness.  Musculoskeletal: She exhibits no edema.  Lymphadenopathy:    She has no cervical adenopathy.  Neurological: She is alert and oriented to person, place, and time.  Skin: Skin is warm and dry.  Psychiatric: She has a normal mood and affect. Her behavior is normal.  Vitals reviewed. foot exam done  Results for orders placed or performed in visit on 05/23/15  COMPLETE METABOLIC PANEL WITH GFR  Result Value Ref Range   Sodium 135 135 - 146 mmol/L   Potassium 4.1 3.5 - 5.3 mmol/L   Chloride 102 98 - 110 mmol/L   CO2 22 20 - 31 mmol/L   Glucose, Bld 208 (H) 65 - 99 mg/dL   BUN 5 (L) 7 - 25 mg/dL   Creat 4.780.49 (L) 2.950.50 - 1.10 mg/dL   Total Bilirubin 0.9 0.2 - 1.2 mg/dL   Alkaline Phosphatase 99 33 - 115 U/L   AST 11 10 - 35 U/L   ALT 13 6 - 29 U/L   Total Protein 7.0 6.1 - 8.1 g/dL   Albumin 3.7 3.6 - 5.1 g/dL   Calcium 8.7 8.6 - 62.110.2 mg/dL   GFR, Est African American >89 >=60 mL/min   GFR, Est Non African American >89 >=60 mL/min  Microalbumin, urine  Result Value Ref Range   Microalb, Ur 33.0 Not estab mg/dL  HgB [id number]  Result Value Ref Range   Hgb A1c MFr Bld 9.7 (H) <5.7 %   Mean Plasma Glucose 232 (H) <117 mg/dL  Lipid Profile  Result Value Ref Range   Cholesterol 188 125 - 200 mg/dL   Triglycerides 657437 (H) <150 mg/dL   HDL 35 (L) >=84>=46 mg/dL   Total CHOL/HDL Ratio 5.4 (H) <=5.0 Ratio   VLDL NOT CALC <30 mg/dL   LDL Cholesterol NOT CALC <130 mg/dL      Assessment & Plan:   Encounter Diagnoses  Name  Primary?  Marland Kitchen. Noninfectious gastroenteritis, unspecified Yes  . Uncontrolled type 2 diabetes mellitus with complication, unspecified long term insulin use status (HCC)   . Hyperlipidemia   . Morbid obesity, unspecified obesity type (HCC)     -rx phenergan- counseled no driving on rx due to sedation. Work note given.  Gave handout on AGE.  She is counseled to RTO if symptoms persist -pt is awaiting appointment with Lipid clinic -Endocrinology appointment tomorrow as scheduled. Pt reminded to take her blood sugar log with her -F/u 3 months. RTO sooner prn

## 2015-07-11 NOTE — Patient Instructions (Signed)
Gastroenteritis viral  (Viral Gastroenteritis)  La gastroenteritis viral también es conocida como gripe del estómago. Este trastorno afecta el estómago y el tubo digestivo. Puede causar diarrea y vómitos repentinos. La enfermedad generalmente dura entre 3 y 8 días. La mayoría de las personas desarrolla una respuesta inmunológica. Con el tiempo, esto elimina el virus. Mientras se desarrolla esta respuesta natural, el virus puede afectar en forma importante su salud.   CAUSAS  Muchos virus diferentes pueden causar gastroenteritis, por ejemplo el rotavirus o el norovirus. Estos virus pueden contagiarse al consumir alimentos o agua contaminados. También puede contagiarse al compartir utensilios u otros artículos personales con una persona infectada o al tocar una superficie contaminada.   SÍNTOMAS  Los síntomas más comunes son diarrea y vómitos. Estos problemas pueden causar una pérdida grave de líquidos corporales(deshidratación) y un desequilibrio de sales corporales(electrolitos). Otros síntomas pueden ser:   · Fiebre.  · Dolor de cabeza.  · Fatiga.  · Dolor abdominal.  DIAGNÓSTICO   El médico podrá hacer el diagnóstico de gastroenteritis viral basándose en los síntomas y el examen físico También pueden tomarle una muestra de materia fecal para diagnosticar la presencia de virus u otras infecciones.   TRATAMIENTO  Esta enfermedad generalmente desaparece sin tratamiento. Los tratamientos están dirigidos a la rehidratación. Los casos más graves de gastroenteritis viral implican vómitos tan intensos que no es posible retener líquidos. En estos casos, los líquidos deben administrarse a través de una vía intravenosa (IV).   INSTRUCCIONES PARA EL CUIDADO DOMICILIARIO  · Beba suficientes líquidos para mantener la orina clara o de color amarillo pálido. Beba pequeñas cantidades de líquido con frecuencia y aumente la cantidad según la tolerancia.  · Pida instrucciones específicas a su médico con respecto a la  rehidratación.  · Evite:    Alimentos que tengan mucha azúcar.    Alcohol.    Gaseosas.    Tabaco.    Jugos.    Bebidas con cafeína.    Líquidos muy calientes o fríos.    Alimentos muy grasos.    Comer demasiado a la vez.    Productos lácteos hasta 24 a 48 horas después de que se detenga la diarrea.  · Puede consumir probióticos. Los probióticos son cultivos activos de bacterias beneficiosas. Pueden disminuir la cantidad y el número de deposiciones diarreicas en el adulto. Se encuentran en los yogures con cultivos activos y en los suplementos.  · Lave bien sus manos para evitar que se disemine el virus.  · Sólo tome medicamentos de venta libre o recetados para calmar el dolor, las molestias o bajar la fiebre según las indicaciones de su médico. No administre aspirina a los niños. Los medicamentos antidiarreicos no son recomendables.  · Consulte a su médico si puede seguir tomando sus medicamentos recetados o de venta libre.  · Cumpla con todas las visitas de control, según le indique su médico.  SOLICITE ATENCIÓN MÉDICA DE INMEDIATO SI:  · No puede retener líquidos.  · No hay emisión de orina durante 6 a 8 horas.  · Le falta el aire.  · Observa sangre en el vómito (se ve como café molido) o en la materia fecal.  · Siente dolor abdominal que empeora o se concentra en una zona pequeña (se localiza).  · Tiene náuseas o vómitos persistentes.  · Tiene fiebre.  · El paciente es un niño menor de 3 meses y tiene fiebre.  · El paciente es un niño mayor de 3 meses, tiene fiebre y síntomas persistentes.  ·   El paciente es un niño mayor de 3 meses y tiene fiebre y síntomas que empeoran repentinamente.  · El paciente es un bebé y no tiene lágrimas cuando llora.  ASEGÚRESE QUE:   · Comprende estas instrucciones.  · Controlará su enfermedad.  · Solicitará ayuda inmediatamente si no mejora o si empeora.     Esta información no tiene como fin reemplazar el consejo del médico. Asegúrese de hacerle al médico cualquier pregunta que  tenga.     Document Released: 03/18/2005 Document Revised: 06/10/2011  [employer] Interactive Patient Education ©2016 [employer]

## 2015-07-12 ENCOUNTER — Ambulatory Visit (INDEPENDENT_AMBULATORY_CARE_PROVIDER_SITE_OTHER): Payer: Self-pay | Admitting: "Endocrinology

## 2015-07-12 ENCOUNTER — Encounter: Payer: Self-pay | Admitting: "Endocrinology

## 2015-07-12 VITALS — BP 116/76 | HR 82 | Ht 62.5 in | Wt 253.0 lb

## 2015-07-12 DIAGNOSIS — I10 Essential (primary) hypertension: Secondary | ICD-10-CM | POA: Insufficient documentation

## 2015-07-12 DIAGNOSIS — E118 Type 2 diabetes mellitus with unspecified complications: Secondary | ICD-10-CM

## 2015-07-12 DIAGNOSIS — E785 Hyperlipidemia, unspecified: Secondary | ICD-10-CM

## 2015-07-12 DIAGNOSIS — E1165 Type 2 diabetes mellitus with hyperglycemia: Secondary | ICD-10-CM

## 2015-07-12 NOTE — Patient Instructions (Signed)

## 2015-07-12 NOTE — Progress Notes (Signed)
Subjective:    Patient ID: Fumiko Cham, female    DOB: Oct 13, 1970. Patient is being seen in consultation for management of diabetes requested by  Jacquelin Hawking, PA-C  Past Medical History  Diagnosis Date  . Diabetes mellitus without complication (HCC) 2014  . H. pylori infection 2014  . Hyperlipidemia    Past Surgical History  Procedure Laterality Date  . Tubal ligation    . Cholecystectomy     Social History   Social History  . Marital Status: Single    Spouse Name: N/A  . Number of Children: N/A  . Years of Education: N/A   Social History Main Topics  . Smoking status: Never Smoker   . Smokeless tobacco: Never Used  . Alcohol Use: No  . Drug Use: No  . Sexual Activity: Not Asked   Other Topics Concern  . None   Social History Narrative   Outpatient Encounter Prescriptions as of 07/12/2015  Medication Sig  . Insulin NPH Isophane & Regular (NOVOLIN 70/30 RELION [place]raim) Inject 35 Units into the skin 2 (two) times daily with a meal.  . aspirin 81 MG tablet Take 81 mg by mouth daily.  . fenofibrate micronized (LOFIBRA) 134 MG capsule Take 134 mg by mouth daily.  Marland Kitchen lisinopril (PRINIVIL,ZESTRIL) 5 MG tablet Take 1 tablet (5 mg total) by mouth daily. Tome una tableta por boca diaria  . metFORMIN (GLUCOPHAGE) 1000 MG tablet 1 po bid for diabetes.  Tome una tableta por boca dos veces diarias con comida  . Omega-3 Fatty Acids (FISH OIL PO) Take by mouth. 4 cap daily  . promethazine (PHENERGAN) 25 MG tablet 1/2 - 1 po q 8 hour prn nausea/vomiting.  1/2 - 1 por boca cada 8 horas cuando necesite para el nausea/vomito  . simvastatin (ZOCOR) 20 MG tablet 1 po qhs for cholesterol.  Tome una tableta por boca al dormir  . [DISCONTINUED] glipiZIDE (GLUCOTROL) 10 MG tablet Take 10 mg by mouth 2 (two) times daily.  . [DISCONTINUED] insulin NPH-regular Human (NOVOLIN 70/30) (70-30) 100 UNIT/ML injection Inject 33 Units into the skin 2 (two) times daily with a meal. inyecte 33  unidades a la piel 2 (dos) veces diarias con comida (Patient taking differently: Inject 35 Units into the skin 2 (two) times daily with a meal. inyecte 33 unidades a la piel 2 (dos) veces diarias con comida)  . [DISCONTINUED] metFORMIN (GLUCOPHAGE) 1000 MG tablet Take 1,000 mg by mouth 2 (two) times daily with a meal.  . [DISCONTINUED] simvastatin (ZOCOR) 20 MG tablet Take 20 mg by mouth at bedtime.   No facility-administered encounter medications on file as of 07/12/2015.   ALLERGIES: No Known Allergies VACCINATION STATUS: Immunization History  Administered Date(s) Administered  . Influenza Split 12/21/2014    Diabetes She presents for her initial (Patient is being seen with Spanish language interpreter.) diabetic visit. She has type 2 diabetes mellitus. Onset time: She was diagnosed at approximate age of 45 years. She denies any history of gestational diabetes. Her disease course has been worsening. There are no hypoglycemic associated symptoms. Pertinent negatives for hypoglycemia include no confusion, headaches, pallor or seizures. Associated symptoms include fatigue, polydipsia and polyuria. Pertinent negatives for diabetes include no chest pain and no polyphagia. There are no hypoglycemic complications. Symptoms are worsening. There are no diabetic complications. Risk factors for coronary artery disease include diabetes mellitus, dyslipidemia, hypertension, obesity and sedentary lifestyle. Current diabetic treatment includes insulin injections and oral agent (monotherapy). Her weight is decreasing  steadily. She is following a generally unhealthy diet. When asked about meal planning, she reported none. She has not had a previous visit with a dietitian. Home blood sugar record trend: She did not bring any meter nor logs to review today. She admits that she does not monitor blood glucose regularly. An ACE inhibitor/angiotensin II receptor blocker is being taken.  Hyperlipidemia This is a chronic  problem. The current episode started more than 1 year ago. Exacerbating diseases include diabetes and obesity. Pertinent negatives include no chest pain, myalgias or shortness of breath. Current antihyperlipidemic treatment includes statins. Risk factors for coronary artery disease include diabetes mellitus, dyslipidemia, hypertension, obesity and a sedentary lifestyle.  Hypertension This is a chronic problem. The current episode started more than 1 year ago. The problem is controlled. Pertinent negatives include no chest pain, headaches, palpitations or shortness of breath. Risk factors for coronary artery disease include obesity, dyslipidemia, sedentary lifestyle and diabetes mellitus. Past treatments include ACE inhibitors.      Review of Systems  Constitutional: Positive for fatigue. Negative for fever, chills and unexpected weight change.  HENT: Negative for trouble swallowing and voice change.   Eyes: Negative for visual disturbance.  Respiratory: Negative for cough, shortness of breath and wheezing.   Cardiovascular: Negative for chest pain, palpitations and leg swelling.  Gastrointestinal: Negative for nausea, vomiting and diarrhea.  Endocrine: Positive for polydipsia and polyuria. Negative for cold intolerance, heat intolerance and polyphagia.  Musculoskeletal: Negative for myalgias and arthralgias.  Skin: Negative for color change, pallor, rash and wound.  Neurological: Negative for seizures and headaches.  Psychiatric/Behavioral: Negative for suicidal ideas and confusion.    Objective:    BP 116/76 mmHg  Pulse 82  Ht 5' 2.5" (1.588 m)  Wt 253 lb (114.76 kg)  BMI 45.51 kg/m2  SpO2 97%  Wt Readings from Last 3 Encounters:  07/12/15 253 lb (114.76 kg)  07/11/15 252 lb 8 oz (114.533 kg)  05/30/15 251 lb (113.853 kg)    Physical Exam  Constitutional: She is oriented to person, place, and time. She appears well-developed.  HENT:  Head: Normocephalic and atraumatic.  Eyes:  EOM are normal.  Neck: Normal range of motion. Neck supple. No tracheal deviation present. No thyromegaly present.  Cardiovascular: Normal rate and regular rhythm.   Pulmonary/Chest: Effort normal and breath sounds normal.  Abdominal: Soft. Bowel sounds are normal. There is no tenderness. There is no guarding.  Musculoskeletal: Normal range of motion. She exhibits no edema.  Neurological: She is alert and oriented to person, place, and time. She has normal reflexes. No cranial nerve deficit. Coordination normal.  Skin: Skin is warm and dry. No rash noted. No erythema. No pallor.  Psychiatric: She has a normal mood and affect. Judgment normal.     CMP ( most recent) CMP     Component Value Date/Time   NA 135 05/27/2015 1033   K 4.1 05/27/2015 1033   CL 102 05/27/2015 1033   CO2 22 05/27/2015 1033   GLUCOSE 208* 05/27/2015 1033   BUN 5* 05/27/2015 1033   CREATININE 0.49* 05/27/2015 1033   CALCIUM 8.7 05/27/2015 1033   PROT 7.0 05/27/2015 1033   ALBUMIN 3.7 05/27/2015 1033   AST 11 05/27/2015 1033   ALT 13 05/27/2015 1033   ALKPHOS 99 05/27/2015 1033   BILITOT 0.9 05/27/2015 1033   GFRNONAA >89 05/27/2015 1033   GFRAA >89 05/27/2015 1033     Diabetic Labs (most recent): Lab Results  Component Value Date  HGBA1C 9.7* 05/27/2015   HGBA1C 9.8* 02/22/2015     Lipid Panel ( most recent) Lipid Panel     Component Value Date/Time   CHOL 188 05/27/2015 1042   TRIG 437* 05/27/2015 1042   HDL 35* 05/27/2015 1042   CHOLHDL 5.4* 05/27/2015 1042   VLDL NOT CALC 05/27/2015 1042   LDLCALC NOT CALC 05/27/2015 1042      Assessment & Plan:   1. Uncontrolled type 2 diabetes mellitus with complication, unspecified long term insulin use status (HCC)   - Patient has currently uncontrolled symptomatic type 2 DM since  45 years of age,  with most recent A1c of 9.7 %. Recent labs reviewed.   Her diabetes is complicated by obesity and patient remains at a high risk for more acute  and chronic complications of diabetes which include CAD, CVA, CKD, retinopathy, and neuropathy. These are all discussed in detail with the patient.  - I have counseled the patient on diet management and weight loss, by adopting a carbohydrate restricted/protein rich diet.  - Suggestion is made for patient to avoid simple carbohydrates   from their diet including Cakes , Desserts, Ice Cream,  Soda (  diet and regular) , Sweet Tea , Candies,  Chips, Cookies, Artificial Sweeteners,   and "Sugar-free" Products . This will help patient to have stable blood glucose profile and potentially avoid unintended weight gain.  - I encouraged the patient to switch to  unprocessed or minimally processed complex starch and increased protein intake (animal or plant source), fruits, and vegetables.  - Patient is advised to stick to a routine mealtimes to eat 3 meals  a day and avoid unnecessary snacks ( to snack only to correct hypoglycemia).  - The patient will be scheduled with Norm SaltPenny Crumpton, RDN, CDE for individualized DM education.  - I have approached patient with the following individualized plan to manage diabetes and patient agrees:   - I  will proceed with premixed insulin Novolin 70/30 35 units   with breakfast and 35 units with supper when pre-meal blood glucose is above 90 mg/dL, associated with strict monitoring of blood glucose 3 times a day before meals.   - Patient is warned not to take insulin without proper monitoring per orders. -Adjustment parameters are given for hypo and hyperglycemia in writing. -Patient is encouraged to call clinic for blood glucose levels less than 70 or above 300 mg /dl. - I will continue metformin 1000 mg by mouth twice a day, therapeutically suitable for patient. - I will discontinue glipizide, risk outweighs benefit for this patient.  - Patient will be considered for incretin therapy as appropriate next visit. - Patient specific target  A1c;  LDL, HDL, Triglycerides,  and  Waist Circumference were discussed in detail.  2) BP/HTN: Controlled. Continue current medications including ACEI/ARB. 3) Lipids/HPL:   continue statins. 4)  Weight/Diet: CDE Consult will be initiated , exercise, and detailed carbohydrates information provided.  5) Chronic Care/Health Maintenance:  -Patient is on ACEI/ARB and Statin medications and encouraged to continue to follow up with Ophthalmology, Podiatrist at least yearly or according to recommendations, and advised to   stay away from smoking. I have recommended yearly flu vaccine and pneumonia vaccination at least every 5 years; moderate intensity exercise for up to 150 minutes weekly; and  sleep for at least 7 hours a day.  - 60 minutes of time was spent on the care of this patient , 50% of which was applied for counseling on diabetes  complications and their preventions.  - Patient to bring meter and  blood glucose logs during their next visit.   - I advised patient to maintain close follow up with Jacquelin Hawking, PA-C for primary care needs.  Follow up plan: - Return in about 10 weeks (around 09/20/2015) for diabetes, high blood pressure, high cholesterol, follow up with pre-visit labs, meter, and logs.  Marquis Lunch, MD Phone: [phone]  Fax: [phone]   07/12/2015, 4:30 PM

## 2015-08-04 ENCOUNTER — Ambulatory Visit: Payer: PRIVATE HEALTH INSURANCE | Admitting: Nutrition

## 2015-08-18 ENCOUNTER — Ambulatory Visit: Payer: PRIVATE HEALTH INSURANCE | Admitting: Nutrition

## 2015-08-22 ENCOUNTER — Encounter: Payer: Self-pay | Admitting: Pediatric Intensive Care

## 2015-08-22 NOTE — Congregational Nurse Program (Signed)
Congregational Nurse Program Note  Date of Encounter: 08/22/2015  Past Medical History: Past Medical History  Diagnosis Date  . Diabetes mellitus without complication (HCC) 2014  . H. pylori infection 2014  . Hyperlipidemia     Encounter Details:     CNP Questionnaire - 08/22/15 1530    Patient Demographics   Is this a new or existing patient? New   Race Latino/Hispanic   Patient Assistance   Location of Patient Assistance [employer]   Patient's financial/insurance status Self-Pay   Uninsured Patient Yes   Interventions Counseled to make appt. with provider   Patient referred to apply for the following financial assistance [employer] [facility]   Food insecurities addressed Not Applicable   Transportation assistance No   Assistance securing medications No   Educational health offerings Diabetes;Nutrition   Encounter Details   Primary purpose of visit Chronic Illness/Condition Visit;Other   Was an Emergency Department visit averted? Not Applicable   Does patient have a medical provider? Yes   Patient referred to Not Applicable   Was a mental health screening completed? (GAINS tool) No   Does patient have dental issues? No   Does patient have vision issues? No   Does your patient have an abnormal blood pressure today? No   Since previous encounter, have you referred patient for abnormal blood pressure that resulted in a new diagnosis or medication change? No   Does your patient have an abnormal blood glucose today? Yes   Since previous encounter, have you referred patient for abnormal blood glucose that resulted in a new diagnosis or medication change? No   Was there a life-saving intervention made? No     Client needed blood sugar check- she has a monitor but the batteries were depleted. Random BG was 232 which client states is high for her. She has a medical provider appointment in July and has an appointment with an RD on 5/26. Client counseled to follow up with CNP  if continued elevated BGs at home.

## 2015-08-24 ENCOUNTER — Other Ambulatory Visit: Payer: Self-pay | Admitting: Physician Assistant

## 2015-08-24 MED ORDER — INSULIN NPH ISOPHANE & REGULAR (70-30) 100 UNIT/ML ~~LOC~~ SUSP: SUBCUTANEOUS | Status: DC

## 2015-08-25 ENCOUNTER — Ambulatory Visit: Payer: PRIVATE HEALTH INSURANCE | Admitting: Nutrition

## 2015-08-30 ENCOUNTER — Ambulatory Visit: Payer: Self-pay | Admitting: Physician Assistant

## 2015-09-13 ENCOUNTER — Encounter: Payer: Self-pay | Admitting: Physician Assistant

## 2015-09-20 ENCOUNTER — Ambulatory Visit: Payer: Self-pay | Admitting: "Endocrinology

## 2015-09-22 ENCOUNTER — Ambulatory Visit: Payer: PRIVATE HEALTH INSURANCE | Admitting: Nutrition

## 2015-09-22 ENCOUNTER — Telehealth: Payer: Self-pay | Admitting: Nutrition

## 2015-09-22 NOTE — Telephone Encounter (Signed)
TC to pt's contact number to inform her daughter than Angelica Bentley missed her appointment to get her labs done, appt with Dr. Fransico HimNida and myself. Pt's daughter stated she would let her mom know and call to reschedule appointments.

## 2015-10-11 ENCOUNTER — Ambulatory Visit: Payer: Self-pay | Admitting: Physician Assistant

## 2015-10-12 ENCOUNTER — Encounter: Payer: Self-pay | Admitting: Physician Assistant

## 2015-10-24 ENCOUNTER — Ambulatory Visit: Payer: Self-pay | Admitting: Physician Assistant

## 2015-10-24 ENCOUNTER — Encounter: Payer: Self-pay | Admitting: Physician Assistant

## 2015-10-24 VITALS — BP 130/66 | HR 90 | Temp 97.9°F | Ht 62.5 in | Wt 261.5 lb

## 2015-10-24 DIAGNOSIS — E1165 Type 2 diabetes mellitus with hyperglycemia: Secondary | ICD-10-CM

## 2015-10-24 DIAGNOSIS — I10 Essential (primary) hypertension: Secondary | ICD-10-CM

## 2015-10-24 DIAGNOSIS — Z9119 Patient's noncompliance with other medical treatment and regimen: Secondary | ICD-10-CM

## 2015-10-24 DIAGNOSIS — Z91199 Patient's noncompliance with other medical treatment and regimen due to unspecified reason: Secondary | ICD-10-CM

## 2015-10-24 DIAGNOSIS — E785 Hyperlipidemia, unspecified: Secondary | ICD-10-CM

## 2015-10-24 DIAGNOSIS — E118 Type 2 diabetes mellitus with unspecified complications: Principal | ICD-10-CM

## 2015-10-24 NOTE — Progress Notes (Signed)
BP 130/66 (BP Location: Left Arm, Patient Position: Sitting, Cuff Size: Large)   Pulse 90   Temp 97.9 F (36.6 C)   Ht 5' 2.5" (1.588 m)   Wt 261 lb 8 oz (118.6 kg)   SpO2 98%   BMI 47.07 kg/m    Subjective:    Patient ID: Angelica Bentley, female    DOB: [birth year], 45 y.o.   MRN: [id number]  HPI: Angelica Bentley is a 45 y.o. female presenting on 10/24/2015 for Follow-up   HPI   Pt missed her appointment with endocrine- she says they charged her.  She says she turned in her [employer] discount application and she didn't get letter back on that will have BV check with Morrie Sheldon.  Pt also missed her appointment here 10/11/15   Pt not working now [employer]) but she hopes to go back when it gets rebuilt  She has checked her sugars occassionally- in am 130  a1c and lipids not checked since feb.  Relevant past medical, surgical, family and social history reviewed and updated as indicated. Interim medical history since our last visit reviewed. Allergies and medications reviewed and updated.   Current Outpatient Prescriptions:  .  aspirin 81 MG tablet, Take 81 mg by mouth daily., Disp: , Rfl:  .  fenofibrate micronized (LOFIBRA) 134 MG capsule, Take 134 mg by mouth daily., Disp: , Rfl:  .  insulin NPH-regular Human (NOVOLIN 70/30) (70-30) 100 UNIT/ML injection, 35 units sq bid as directed.   35 unidades subcutaneos dos veces al dia como indicado, Disp: 10 mL, Rfl: 4 .  lisinopril (PRINIVIL,ZESTRIL) 5 MG tablet, Take 1 tablet (5 mg total) by mouth daily. Tome una tableta por boca diaria, Disp: 30 tablet, Rfl: 3 .  metFORMIN (GLUCOPHAGE) 1000 MG tablet, 1 po bid for diabetes.  Tome una tableta por boca dos veces diarias con comida, Disp: 60 tablet, Rfl: 3 .  Omega-3 Fatty Acids (FISH OIL PO), Take by mouth. 4 cap daily, Disp: , Rfl:  .  simvastatin (ZOCOR) 20 MG tablet, 1 po qhs for cholesterol.  Tome una tableta por boca al dormir, Disp: 30 tablet, Rfl: 4   Review of Systems   Constitutional: Negative for appetite change, chills, diaphoresis, fatigue, fever and unexpected weight change.  HENT: Negative for congestion, drooling, ear pain, facial swelling, hearing loss, mouth sores, sneezing, sore throat, trouble swallowing and voice change.   Eyes: Negative for pain, discharge, redness, itching and visual disturbance.  Respiratory: Negative for cough, choking, shortness of breath and wheezing.   Cardiovascular: Negative for chest pain, palpitations and leg swelling.  Gastrointestinal: Negative for abdominal pain, blood in stool, constipation, diarrhea and vomiting.  Endocrine: Negative for cold intolerance, heat intolerance and polydipsia.  Genitourinary: Negative for decreased urine volume, dysuria and hematuria.  Musculoskeletal: Negative for arthralgias, back pain and gait problem.  Skin: Negative for rash.  Allergic/Immunologic: Negative for environmental allergies.  Neurological: Positive for headaches. Negative for seizures, syncope and light-headedness.  Hematological: Negative for adenopathy.  Psychiatric/Behavioral: Negative for agitation, dysphoric mood and suicidal ideas. The patient is not nervous/anxious.     Per HPI unless specifically indicated above     Objective:    BP 130/66 (BP Location: Left Arm, Patient Position: Sitting, Cuff Size: Large)   Pulse 90   Temp 97.9 F (36.6 C)   Ht 5' 2.5" (1.588 m)   Wt 261 lb 8 oz (118.6 kg)   SpO2 98%   BMI 47.07 kg/m   Wt Readings  from Last 3 Encounters:  10/24/15 261 lb 8 oz (118.6 kg)  07/12/15 253 lb (114.8 kg)  07/11/15 252 lb 8 oz (114.5 kg)    Physical Exam  Constitutional: She is oriented to person, place, and time. She appears well-developed and well-nourished.  HENT:  Head: Normocephalic and atraumatic.  Neck: Neck supple.  Cardiovascular: Normal rate and regular rhythm.   Pulmonary/Chest: Effort normal and breath sounds normal.  Abdominal: Soft. Bowel sounds are normal. She  exhibits no mass. There is no hepatosplenomegaly. There is no tenderness.  Musculoskeletal: She exhibits no edema.  Lymphadenopathy:    She has no cervical adenopathy.  Neurological: She is alert and oriented to person, place, and time.  Skin: Skin is warm and dry.  Psychiatric: She has a normal mood and affect. Her behavior is normal.  Vitals reviewed.       Assessment & Plan:   Encounter Diagnoses  Name Primary?  Marland Kitchen Uncontrolled type 2 diabetes mellitus with complication, unspecified long term insulin use status (HCC) Yes  . Essential hypertension, benign   . Hyperlipidemia   . Morbid obesity, unspecified obesity type (HCC)   . Personal history of noncompliance with medical treatment, presenting hazards to health      -pt to get Fasting labs tomrrow  -F/u 1 week

## 2015-10-26 LAB — LIPID PANEL
CHOL/HDL RATIO: 4 ratio (ref <=5.0)
CHOLESTEROL: 173 mg/dL (ref 125–200)
HDL: 43 mg/dL — AB (ref >=46)
TRIGLYCERIDES: 412 mg/dL — AB (ref <150)

## 2015-10-26 LAB — COMPLETE METABOLIC PANEL WITH GFR
ALBUMIN: 3.7 g/dL (ref 3.6–5.1)
ALK PHOS: 97 U/L (ref 33–115)
ALT: 11 U/L (ref 6–29)
AST: 9 U/L — ABNORMAL LOW (ref 10–35)
BUN: 9 mg/dL (ref 7–25)
CALCIUM: 8.7 mg/dL (ref 8.6–10.2)
CHLORIDE: 103 mmol/L (ref 98–110)
CO2: 23 mmol/L (ref 20–31)
Creat: 0.52 mg/dL (ref 0.50–1.10)
Glucose, Bld: 233 mg/dL — ABNORMAL HIGH (ref 65–99)
POTASSIUM: 4.1 mmol/L (ref 3.5–5.3)
Sodium: 136 mmol/L (ref 135–146)
Total Bilirubin: 0.8 mg/dL (ref 0.2–1.2)
Total Protein: 7 g/dL (ref 6.1–8.1)

## 2015-10-26 LAB — HEMOGLOBIN: HEMOGLOBIN: 11.7 g/dL (ref 11.7–15.5)

## 2015-10-26 LAB — HEMOGLOBIN A1C
Hgb A1c MFr Bld: 8.6 % — ABNORMAL HIGH (ref <5.7)
Mean Plasma Glucose: 200 mg/dL

## 2015-10-31 ENCOUNTER — Ambulatory Visit: Payer: Self-pay | Admitting: Physician Assistant

## 2015-10-31 ENCOUNTER — Encounter: Payer: Self-pay | Admitting: Physician Assistant

## 2015-10-31 VITALS — BP 116/70 | HR 94 | Temp 98.1°F | Ht 62.5 in | Wt 263.6 lb

## 2015-10-31 DIAGNOSIS — E785 Hyperlipidemia, unspecified: Secondary | ICD-10-CM

## 2015-10-31 DIAGNOSIS — E118 Type 2 diabetes mellitus with unspecified complications: Principal | ICD-10-CM

## 2015-10-31 DIAGNOSIS — E1165 Type 2 diabetes mellitus with hyperglycemia: Secondary | ICD-10-CM

## 2015-10-31 DIAGNOSIS — I10 Essential (primary) hypertension: Secondary | ICD-10-CM

## 2015-10-31 MED ORDER — FENOFIBRATE MICRONIZED 134 MG PO CAPS: ORAL_CAPSULE | ORAL | 4 refills | Status: DC

## 2015-10-31 NOTE — Progress Notes (Signed)
BP 116/70 (BP Location: Left Arm, Patient Position: Sitting, Cuff Size: Large)   Pulse 94   Temp 98.1 F (36.7 C) (Other (Comment))   Ht 5' 2.5" (1.588 m)   Wt 263 lb 9.6 oz (119.6 kg)   LMP 10/09/2015   SpO2 98%   BMI 47.44 kg/m    Subjective:    Patient ID: Angelica Bentley, female    DOB: 06-18-1970, 45 y.o.   MRN: [id number]  HPI: Angelica Bentley is a 45 y.o. female presenting on 10/31/2015 for Diabetes   HPI   Pt not taking the fenofibrate.  She says dr Fransico Him told her to stop it.  The office noted doesn't mention anything about the fenofibrate. Wonder if perhaps was mistaken- her glipizide was discontinued at that appointment  bs log: am- 111-122, pm 140-161  Relevant past medical, surgical, family and social history reviewed and updated as indicated. Interim medical history since our last visit reviewed. Allergies and medications reviewed and updated.   Current Outpatient Prescriptions:  .  aspirin 81 MG tablet, Take 81 mg by mouth daily., Disp: , Rfl:  .  insulin NPH-regular Human (NOVOLIN 70/30) (70-30) 100 UNIT/ML injection, 35 units sq bid as directed.   35 unidades subcutaneos dos veces al dia como indicado, Disp: 10 mL, Rfl: 4 .  lisinopril (PRINIVIL,ZESTRIL) 5 MG tablet, Take 1 tablet (5 mg total) by mouth daily. Tome una tableta por boca diaria, Disp: 30 tablet, Rfl: 3 .  metFORMIN (GLUCOPHAGE) 1000 MG tablet, 1 po bid for diabetes.  Tome una tableta por boca dos veces diarias con comida, Disp: 60 tablet, Rfl: 3 .  Omega-3 Fatty Acids (FISH OIL PO), Take by mouth. 4 cap daily, Disp: , Rfl:  .  simvastatin (ZOCOR) 20 MG tablet, 1 po qhs for cholesterol.  Tome una tableta por boca al dormir, Disp: 30 tablet, Rfl: 4 .  fenofibrate micronized (LOFIBRA) 134 MG capsule, Take 134 mg by mouth daily., Disp: , Rfl:    Review of Systems  Constitutional: Negative for appetite change, chills, diaphoresis, fatigue, fever and unexpected weight change.  HENT: Negative for  congestion, drooling, ear pain, facial swelling, hearing loss, mouth sores, sneezing, sore throat, trouble swallowing and voice change.   Eyes: Negative for pain, discharge, redness, itching and visual disturbance.  Respiratory: Negative for cough, choking, shortness of breath and wheezing.   Cardiovascular: Negative for chest pain, palpitations and leg swelling.  Gastrointestinal: Negative for abdominal pain, blood in stool, constipation, diarrhea and vomiting.  Endocrine: Positive for cold intolerance. Negative for heat intolerance and polydipsia.  Genitourinary: Negative for decreased urine volume, dysuria and hematuria.  Musculoskeletal: Negative for arthralgias, back pain and gait problem.  Skin: Negative for rash.  Allergic/Immunologic: Negative for environmental allergies.  Neurological: Negative for seizures, syncope, light-headedness and headaches.  Hematological: Negative for adenopathy.  Psychiatric/Behavioral: Negative for agitation, dysphoric mood and suicidal ideas. The patient is not nervous/anxious.     Per HPI unless specifically indicated above     Objective:    BP 116/70 (BP Location: Left Arm, Patient Position: Sitting, Cuff Size: Large)   Pulse 94   Temp 98.1 F (36.7 C) (Other (Comment))   Ht 5' 2.5" (1.588 m)   Wt 263 lb 9.6 oz (119.6 kg)   LMP 10/09/2015   SpO2 98%   BMI 47.44 kg/m   Wt Readings from Last 3 Encounters:  10/31/15 263 lb 9.6 oz (119.6 kg)  10/24/15 261 lb 8 oz (118.6 kg)  07/12/15 253  lb (114.8 kg)    Physical Exam  Constitutional: She is oriented to person, place, and time. She appears well-developed and well-nourished.  HENT:  Head: Normocephalic and atraumatic.  Neck: Neck supple.  Cardiovascular: Normal rate and regular rhythm.   Pulmonary/Chest: Effort normal and breath sounds normal.  Abdominal: Soft. Bowel sounds are normal. She exhibits no mass. There is no hepatosplenomegaly. There is no tenderness.  Musculoskeletal: She  exhibits no edema.  Lymphadenopathy:    She has no cervical adenopathy.  Neurological: She is alert and oriented to person, place, and time.  Skin: Skin is warm and dry.  Psychiatric: She has a normal mood and affect. Her behavior is normal.  Vitals reviewed.   Results for orders placed or performed in visit on 10/24/15  HgB A1c  Result Value Ref Range   Hgb A1c MFr Bld 8.6 (H) <5.7 %   Mean Plasma Glucose 200 mg/dL  Lipid Profile  Result Value Ref Range   Cholesterol 173 125 - 200 mg/dL   Triglycerides 409 (H) <150 mg/dL   HDL 43 (L) >=81 mg/dL   Total CHOL/HDL Ratio 4.0 <=5.0 Ratio   VLDL NOT CALC <30 mg/dL   LDL Cholesterol NOT CALC <130 mg/dL  COMPLETE METABOLIC PANEL WITH GFR  Result Value Ref Range   Sodium 136 135 - 146 mmol/L   Potassium 4.1 3.5 - 5.3 mmol/L   Chloride 103 98 - 110 mmol/L   CO2 23 20 - 31 mmol/L   Glucose, Bld 233 (H) 65 - 99 mg/dL   BUN 9 7 - 25 mg/dL   Creat 1.91 4.78 - 2.95 mg/dL   Total Bilirubin 0.8 0.2 - 1.2 mg/dL   Alkaline Phosphatase 97 33 - 115 U/L   AST 9 (L) 10 - 35 U/L   ALT 11 6 - 29 U/L   Total Protein 7.0 6.1 - 8.1 g/dL   Albumin 3.7 3.6 - 5.1 g/dL   Calcium 8.7 8.6 - 62.1 mg/dL   GFR, Est African American >89 >=60 mL/min   GFR, Est Non African American >89 >=60 mL/min  Hemoglobin  Result Value Ref Range   Hemoglobin 11.7 11.7 - 15.5 g/dL      Assessment & Plan:   Encounter Diagnoses  Name Primary?  Marland Kitchen Uncontrolled type 2 diabetes mellitus with complication, unspecified long term insulin use status (HCC) Yes  . Hyperlipidemia   . Essential hypertension, benign   . Morbid obesity, unspecified obesity type (HCC)     -Reviewed labs with pt -restart fenofibrate -still haven't heard yet back from [place] on [employer] - will check so pt can return to endocrine w/o charge -Increase morning insulin to 38 u and dinnertime to 36 units -f/u 1 month with bs log

## 2015-12-05 ENCOUNTER — Encounter: Payer: Self-pay | Admitting: Physician Assistant

## 2015-12-05 ENCOUNTER — Ambulatory Visit: Payer: Self-pay | Admitting: Physician Assistant

## 2015-12-05 VITALS — BP 116/78 | HR 87 | Temp 97.9°F | Ht 62.5 in | Wt 259.2 lb

## 2015-12-05 DIAGNOSIS — R05 Cough: Secondary | ICD-10-CM

## 2015-12-05 DIAGNOSIS — I10 Essential (primary) hypertension: Secondary | ICD-10-CM

## 2015-12-05 DIAGNOSIS — J069 Acute upper respiratory infection, unspecified: Secondary | ICD-10-CM

## 2015-12-05 DIAGNOSIS — R059 Cough, unspecified: Secondary | ICD-10-CM

## 2015-12-05 DIAGNOSIS — E118 Type 2 diabetes mellitus with unspecified complications: Principal | ICD-10-CM

## 2015-12-05 DIAGNOSIS — E1165 Type 2 diabetes mellitus with hyperglycemia: Secondary | ICD-10-CM

## 2015-12-05 DIAGNOSIS — E785 Hyperlipidemia, unspecified: Secondary | ICD-10-CM

## 2015-12-05 MED ORDER — BENZONATATE 100 MG PO CAPS: ORAL_CAPSULE | ORAL | 3 refills | Status: DC

## 2015-12-05 NOTE — Progress Notes (Signed)
BP 116/78 (BP Location: Left Arm, Patient Position: Sitting, Cuff Size: Large)   Pulse 87   Temp 97.9 F (36.6 C)   Ht 5' 2.5" (1.588 m)   Wt 259 lb 4 oz (117.6 kg)   SpO2 99%   BMI 46.66 kg/m    Subjective:    Patient ID: Angelica Bentley, female    DOB: 03/27/1971, 45 y.o.   MRN: [id number]  HPI: Angelica Bentley is a 45 y.o. female presenting on 12/05/2015 for Diabetes   HPI    bs log reviewed- am- 120-140, evening 129-145 Pt c/o cough started on Saturday.  No ST, rhinorhea.  Her R ear hurts but she attributes that to toothache.   Relevant past medical, surgical, family and social history reviewed and updated as indicated. Interim medical history since our last visit reviewed. Allergies and medications reviewed and updated.  Current Outpatient Prescriptions:  .  aspirin 81 MG tablet, Take 81 mg by mouth daily., Disp: , Rfl:  .  fenofibrate micronized (LOFIBRA) 134 MG capsule, 1 po qd.  Tome una tableta por boca diaria, Disp: 30 capsule, Rfl: 4 .  insulin NPH-regular Human (NOVOLIN 70/30) (70-30) 100 UNIT/ML injection, 35 units sq bid as directed.   35 unidades subcutaneos dos veces al dia como indicado (Patient taking differently: 38 units sq qAM and 36 units sq qhs), Disp: 10 mL, Rfl: 4 .  lisinopril (PRINIVIL,ZESTRIL) 5 MG tablet, Take 1 tablet (5 mg total) by mouth daily. Tome una tableta por boca diaria, Disp: 30 tablet, Rfl: 3 .  metFORMIN (GLUCOPHAGE) 1000 MG tablet, 1 po bid for diabetes.  Tome una tableta por boca dos veces diarias con comida, Disp: 60 tablet, Rfl: 3 .  Omega-3 Fatty Acids (FISH OIL PO), Take by mouth. 4 cap daily, Disp: , Rfl:  .  simvastatin (ZOCOR) 20 MG tablet, 1 po qhs for cholesterol.  Tome una tableta por boca al dormir, Disp: 30 tablet, Rfl: 4  Review of Systems  Constitutional: Negative for appetite change, chills, diaphoresis, fatigue, fever and unexpected weight change.  HENT: Positive for dental problem. Negative for congestion,  drooling, ear pain, facial swelling, hearing loss, mouth sores, sneezing, sore throat, trouble swallowing and voice change.   Eyes: Negative for pain, discharge, redness, itching and visual disturbance.  Respiratory: Positive for cough. Negative for choking, shortness of breath and wheezing.   Cardiovascular: Positive for chest pain (when coughing). Negative for palpitations and leg swelling.  Gastrointestinal: Negative for abdominal pain, blood in stool, constipation, diarrhea and vomiting.  Endocrine: Negative for cold intolerance, heat intolerance and polydipsia.  Genitourinary: Negative for decreased urine volume, dysuria and hematuria.  Musculoskeletal: Negative for arthralgias, back pain and gait problem.  Skin: Negative for rash.  Allergic/Immunologic: Negative for environmental allergies.  Neurological: Negative for seizures, syncope, light-headedness and headaches.  Hematological: Negative for adenopathy.  Psychiatric/Behavioral: Negative for agitation, dysphoric mood and suicidal ideas. The patient is not nervous/anxious.     Per HPI unless specifically indicated above     Objective:    BP 116/78 (BP Location: Left Arm, Patient Position: Sitting, Cuff Size: Large)   Pulse 87   Temp 97.9 F (36.6 C)   Ht 5' 2.5" (1.588 m)   Wt 259 lb 4 oz (117.6 kg)   SpO2 99%   BMI 46.66 kg/m   Wt Readings from Last 3 Encounters:  12/05/15 259 lb 4 oz (117.6 kg)  10/31/15 263 lb 9.6 oz (119.6 kg)  10/24/15 261 lb 8 oz (  118.6 kg)    Physical Exam  Constitutional: She is oriented to person, place, and time. She appears well-developed and well-nourished.  HENT:  Head: Normocephalic and atraumatic.  Right Ear: Hearing, tympanic membrane, external ear and ear canal normal.  Left Ear: Hearing, tympanic membrane and external ear normal. A foreign body (cerumen) is present.  Nose: Nose normal.  Mouth/Throat: Uvula is midline and oropharynx is clear and moist. No oropharyngeal exudate.   Neck: Neck supple.  Cardiovascular: Normal rate and regular rhythm.   Pulmonary/Chest: Effort normal and breath sounds normal. She has no wheezes.  Abdominal: Soft. Bowel sounds are normal. She exhibits no mass. There is no hepatosplenomegaly. There is no tenderness.  Musculoskeletal: She exhibits no edema.  Lymphadenopathy:    She has no cervical adenopathy.  Neurological: She is alert and oriented to person, place, and time.  Skin: Skin is warm and dry.  Psychiatric: She has a normal mood and affect. Her behavior is normal.  Vitals reviewed.      Assessment & Plan:   Encounter Diagnoses  Name Primary?  Marland Kitchen Uncontrolled type 2 diabetes mellitus with complication, unspecified long term insulin use status (HCC) Yes  . Cough   . Acute upper respiratory infection   . Essential hypertension, benign   . Hyperlipidemia   . Morbid obesity, unspecified obesity type (HCC)     -Increase novolin 70/30 to 40 u bid.  Continue other medications -pt reminded to contact office for fbs <70 or >300 -rx tessalon for cough.  Counseled on URI -awaiting word from [facility] on pt's discount application so she can return to endocrine for her diabetes -f/u 2 months.  RTO sooner prn

## 2016-01-31 ENCOUNTER — Other Ambulatory Visit: Payer: Self-pay | Admitting: Student

## 2016-01-31 DIAGNOSIS — E1165 Type 2 diabetes mellitus with hyperglycemia: Secondary | ICD-10-CM

## 2016-01-31 DIAGNOSIS — E785 Hyperlipidemia, unspecified: Secondary | ICD-10-CM

## 2016-01-31 DIAGNOSIS — I10 Essential (primary) hypertension: Secondary | ICD-10-CM

## 2016-01-31 DIAGNOSIS — E118 Type 2 diabetes mellitus with unspecified complications: Principal | ICD-10-CM

## 2016-02-01 LAB — COMPREHENSIVE METABOLIC PANEL
ALK PHOS: 86 U/L (ref 33–115)
ALT: 13 U/L (ref 6–29)
AST: 12 U/L (ref 10–35)
Albumin: 3.6 g/dL (ref 3.6–5.1)
BUN: 7 mg/dL (ref 7–25)
CALCIUM: 8.5 mg/dL — AB (ref 8.6–10.2)
CHLORIDE: 106 mmol/L (ref 98–110)
CO2: 20 mmol/L (ref 20–31)
Creat: 0.49 mg/dL — ABNORMAL LOW (ref 0.50–1.10)
Glucose, Bld: 219 mg/dL — ABNORMAL HIGH (ref 65–99)
POTASSIUM: 3.6 mmol/L (ref 3.5–5.3)
Sodium: 136 mmol/L (ref 135–146)
TOTAL PROTEIN: 7 g/dL (ref 6.1–8.1)
Total Bilirubin: 0.9 mg/dL (ref 0.2–1.2)

## 2016-02-01 LAB — LIPID PANEL
CHOL/HDL RATIO: 5 ratio (ref <=5.0)
CHOLESTEROL: 179 mg/dL (ref 125–200)
HDL: 36 mg/dL — AB (ref >=46)
TRIGLYCERIDES: 414 mg/dL — AB (ref <150)

## 2016-02-01 LAB — HEMOGLOBIN A1C
Hgb A1c MFr Bld: 8.8 % — ABNORMAL HIGH (ref <5.7)
MEAN PLASMA GLUCOSE: 206 mg/dL

## 2016-02-05 ENCOUNTER — Encounter: Payer: Self-pay | Admitting: Physician Assistant

## 2016-02-05 ENCOUNTER — Ambulatory Visit: Payer: Self-pay | Admitting: Physician Assistant

## 2016-02-05 VITALS — BP 116/72 | HR 78 | Temp 97.7°F | Ht 62.5 in

## 2016-02-05 DIAGNOSIS — E1165 Type 2 diabetes mellitus with hyperglycemia: Secondary | ICD-10-CM

## 2016-02-05 DIAGNOSIS — E118 Type 2 diabetes mellitus with unspecified complications: Principal | ICD-10-CM

## 2016-02-05 DIAGNOSIS — E785 Hyperlipidemia, unspecified: Secondary | ICD-10-CM

## 2016-02-05 DIAGNOSIS — I10 Essential (primary) hypertension: Secondary | ICD-10-CM

## 2016-02-05 NOTE — Progress Notes (Signed)
BP 116/72   Pulse 78   Temp 97.7 F (36.5 C)   Ht 5' 2.5" (1.588 m)   SpO2 99%    Subjective:    Patient ID: Angelica Bentley, female    DOB: 04/10/1970, 45 y.o.   MRN: [id number]  HPI: Angelica Bentley is a 45 y.o. female presenting on 02/05/2016 for Diabetes   HPI   Pt didn't change her insulin- was supposed to increase to 40ubid but only increased to 40 in the afternoon and was using 35 in the morning.  She didn't bring her bs log.  Says she just came from work and forgot the log sheet.  Relevant past medical, surgical, family and social history reviewed and updated as indicated. Interim medical history since our last visit reviewed. Allergies and medications reviewed and updated.   Current Outpatient Prescriptions:  .  aspirin 81 MG tablet, Take 81 mg by mouth daily., Disp: , Rfl:  .  benzonatate (TESSALON) 100 MG capsule, Take 1 or 2 capsules po q 8 hour prn cough.  Tome 1 o 2 capsulas por boca cada 8 horas cuando sea necesario para la toz, Disp: 30 capsule, Rfl: 3 .  fenofibrate micronized (LOFIBRA) 134 MG capsule, 1 po qd.  Tome una tableta por boca diaria, Disp: 30 capsule, Rfl: 4 .  insulin NPH-regular Human (NOVOLIN 70/30) (70-30) 100 UNIT/ML injection, 35 units sq bid as directed.   35 unidades subcutaneos dos veces al dia como indicado (Patient taking differently: Inject 35 Units into the skin 2 (two) times daily with a meal. 38 units sq qAM and 36 units sq qhs), Disp: 10 mL, Rfl: 4 .  lisinopril (PRINIVIL,ZESTRIL) 5 MG tablet, Take 1 tablet (5 mg total) by mouth daily. Tome una tableta por boca diaria, Disp: 30 tablet, Rfl: 3 .  metFORMIN (GLUCOPHAGE) 1000 MG tablet, 1 po bid for diabetes.  Tome una tableta por boca dos veces diarias con comida, Disp: 60 tablet, Rfl: 3 .  Omega-3 Fatty Acids (FISH OIL PO), Take by mouth. 4 cap daily, Disp: , Rfl:  .  simvastatin (ZOCOR) 20 MG tablet, 1 po qhs for cholesterol.  Tome una tableta por boca al dormir, Disp: 30 tablet,  Rfl: 4   Review of Systems  Constitutional: Negative for appetite change, chills, diaphoresis, fatigue, fever and unexpected weight change.  HENT: Positive for dental problem. Negative for congestion, drooling, ear pain, facial swelling, hearing loss, mouth sores, sneezing, sore throat, trouble swallowing and voice change.   Eyes: Negative for pain, discharge, redness, itching and visual disturbance.  Respiratory: Negative for cough, choking, shortness of breath and wheezing.   Cardiovascular: Negative for chest pain, palpitations and leg swelling.  Gastrointestinal: Negative for abdominal pain, blood in stool, constipation, diarrhea and vomiting.  Endocrine: Negative for cold intolerance, heat intolerance and polydipsia.  Genitourinary: Negative for decreased urine volume, dysuria and hematuria.  Musculoskeletal: Negative for arthralgias, back pain and gait problem.  Skin: Negative for rash.  Allergic/Immunologic: Negative for environmental allergies.  Neurological: Negative for seizures, syncope, light-headedness and headaches.  Hematological: Negative for adenopathy.  Psychiatric/Behavioral: Negative for agitation, dysphoric mood and suicidal ideas. The patient is not nervous/anxious.     Per HPI unless specifically indicated above     Objective:    BP 116/72   Pulse 78   Temp 97.7 F (36.5 C)   Ht 5' 2.5" (1.588 m)   SpO2 99%   Wt Readings from Last 3 Encounters:  12/05/15 259 lb 4 oz (  117.6 kg)  10/31/15 263 lb 9.6 oz (119.6 kg)  10/24/15 261 lb 8 oz (118.6 kg)    Physical Exam  Constitutional: She is oriented to person, place, and time. She appears well-developed and well-nourished.  HENT:  Head: Normocephalic and atraumatic.  Neck: Neck supple.  Cardiovascular: Normal rate and regular rhythm.   Pulmonary/Chest: Effort normal and breath sounds normal.  Abdominal: Soft. Bowel sounds are normal. She exhibits no mass. There is no hepatosplenomegaly. There is no  tenderness.  Musculoskeletal: She exhibits no edema.  Lymphadenopathy:    She has no cervical adenopathy.  Neurological: She is alert and oriented to person, place, and time.  Skin: Skin is warm and dry.  Psychiatric: She has a normal mood and affect. Her behavior is normal.  Vitals reviewed.   Results for orders placed or performed in visit on 01/31/16  HgB A1c  Result Value Ref Range   Hgb A1c MFr Bld 8.8 (H) <5.7 %   Mean Plasma Glucose 206 mg/dL  Comprehensive Metabolic Panel (CMET)  Result Value Ref Range   Sodium 136 135 - 146 mmol/L   Potassium 3.6 3.5 - 5.3 mmol/L   Chloride 106 98 - 110 mmol/L   CO2 20 20 - 31 mmol/L   Glucose, Bld 219 (H) 65 - 99 mg/dL   BUN 7 7 - 25 mg/dL   Creat 4.090.49 (L) 8.110.50 - 1.10 mg/dL   Total Bilirubin 0.9 0.2 - 1.2 mg/dL   Alkaline Phosphatase 86 33 - 115 U/L   AST 12 10 - 35 U/L   ALT 13 6 - 29 U/L   Total Protein 7.0 6.1 - 8.1 g/dL   Albumin 3.6 3.6 - 5.1 g/dL   Calcium 8.5 (L) 8.6 - 10.2 mg/dL  Lipid Profile  Result Value Ref Range   Cholesterol 179 125 - 200 mg/dL   Triglycerides 914414 (H) <150 mg/dL   HDL 36 (L) >=78>=46 mg/dL   Total CHOL/HDL Ratio 5.0 <=5.0 Ratio   VLDL NOT CALC <30 mg/dL   LDL Cholesterol NOT CALC <130 mg/dL      Assessment & Plan:   Encounter Diagnoses  Name Primary?  Marland Kitchen. Uncontrolled type 2 diabetes mellitus with complication, unspecified long term insulin use status (HCC) Yes  . Hyperlipidemia, unspecified hyperlipidemia type   . Essential hypertension, benign   . Morbid obesity, unspecified obesity type (HCC)     -reviewed labs with pt -increase insulin to 40u bid and bring bs log to f/u OV -refer to lipid clinic in light of persistent elevations despite Rx and lowfat diet -Gave [facility] discount application -F/u 1 month with bs log

## 2016-03-06 ENCOUNTER — Encounter: Payer: Self-pay | Admitting: Physician Assistant

## 2016-03-06 ENCOUNTER — Ambulatory Visit: Payer: Self-pay | Admitting: Physician Assistant

## 2016-03-06 VITALS — BP 124/68 | HR 92 | Temp 97.3°F | Ht 62.5 in | Wt 260.5 lb

## 2016-03-06 DIAGNOSIS — I10 Essential (primary) hypertension: Secondary | ICD-10-CM

## 2016-03-06 DIAGNOSIS — E1165 Type 2 diabetes mellitus with hyperglycemia: Secondary | ICD-10-CM

## 2016-03-06 DIAGNOSIS — Z1239 Encounter for other screening for malignant neoplasm of breast: Secondary | ICD-10-CM

## 2016-03-06 DIAGNOSIS — E118 Type 2 diabetes mellitus with unspecified complications: Principal | ICD-10-CM

## 2016-03-06 DIAGNOSIS — E785 Hyperlipidemia, unspecified: Secondary | ICD-10-CM

## 2016-03-06 MED ORDER — INSULIN NPH ISOPHANE & REGULAR (70-30) 100 UNIT/ML ~~LOC~~ SUSP: SUBCUTANEOUS | 3 refills | Status: DC

## 2016-03-06 NOTE — Progress Notes (Signed)
BP 124/68 (BP Location: Left Arm, Patient Position: Sitting, Cuff Size: Large)   Pulse 92   Temp 97.3 F (36.3 C)   Ht 5' 2.5" (1.588 m)   Wt 260 lb 8 oz (118.2 kg)   SpO2 98%   BMI 46.89 kg/m    Subjective:    Patient ID: Angelica Bentley, female    DOB: 08/07/1970, 45 y.o.   MRN: [id number]  HPI: Angelica Bentley is a 45 y.o. female presenting on 03/06/2016 for Diabetes   HPI   Pt doing well.   Reviewed  bs log- 120-140 mostly  Relevant past medical, surgical, family and social history reviewed and updated as indicated. Interim medical history since our last visit reviewed. Allergies and medications reviewed and updated.   Current Outpatient Prescriptions:  .  aspirin 81 MG tablet, Take 81 mg by mouth daily., Disp: , Rfl:  .  benzonatate (TESSALON) 100 MG capsule, Take 1 or 2 capsules po q 8 hour prn cough.  Tome 1 o 2 capsulas por boca cada 8 horas cuando sea necesario para la toz, Disp: 30 capsule, Rfl: 3 .  fenofibrate micronized (LOFIBRA) 134 MG capsule, 1 po qd.  Tome una tableta por boca diaria, Disp: 30 capsule, Rfl: 4 .  insulin NPH-regular Human (NOVOLIN 70/30) (70-30) 100 UNIT/ML injection, 35 units sq bid as directed.   35 unidades subcutaneos dos veces al dia como indicado (Patient taking differently: Inject 40 Units into the skin 2 (two) times daily with a meal. ), Disp: 10 mL, Rfl: 4 .  lisinopril (PRINIVIL,ZESTRIL) 5 MG tablet, Take 1 tablet (5 mg total) by mouth daily. Tome una tableta por boca diaria, Disp: 30 tablet, Rfl: 3 .  metFORMIN (GLUCOPHAGE) 1000 MG tablet, 1 po bid for diabetes.  Tome una tableta por boca dos veces diarias con comida, Disp: 60 tablet, Rfl: 3 .  Omega-3 Fatty Acids (FISH OIL PO), Take by mouth. 4 cap daily, Disp: , Rfl:  .  simvastatin (ZOCOR) 20 MG tablet, 1 po qhs for cholesterol.  Tome una tableta por boca al dormir, Disp: 30 tablet, Rfl: 4   Review of Systems  Constitutional: Negative for appetite change, chills,  diaphoresis, fatigue, fever and unexpected weight change.  HENT: Positive for dental problem. Negative for congestion, drooling, ear pain, facial swelling, hearing loss, mouth sores, sneezing, sore throat, trouble swallowing and voice change.   Eyes: Negative for pain, discharge, redness, itching and visual disturbance.  Respiratory: Negative for cough, choking, shortness of breath and wheezing.   Cardiovascular: Negative for chest pain, palpitations and leg swelling.  Gastrointestinal: Negative for abdominal pain, blood in stool, constipation, diarrhea and vomiting.  Endocrine: Negative for cold intolerance, heat intolerance and polydipsia.  Genitourinary: Negative for decreased urine volume, dysuria and hematuria.  Musculoskeletal: Negative for arthralgias, back pain and gait problem.  Skin: Negative for rash.  Allergic/Immunologic: Negative for environmental allergies.  Neurological: Negative for seizures, syncope, light-headedness and headaches.  Hematological: Negative for adenopathy.  Psychiatric/Behavioral: Negative for agitation, dysphoric mood and suicidal ideas. The patient is not nervous/anxious.     Per HPI unless specifically indicated above     Objective:    BP 124/68 (BP Location: Left Arm, Patient Position: Sitting, Cuff Size: Large)   Pulse 92   Temp 97.3 F (36.3 C)   Ht 5' 2.5" (1.588 m)   Wt 260 lb 8 oz (118.2 kg)   SpO2 98%   BMI 46.89 kg/m   Wt Readings from Last 3 Encounters:  03/06/16 260 lb 8 oz (118.2 kg)  12/05/15 259 lb 4 oz (117.6 kg)  10/31/15 263 lb 9.6 oz (119.6 kg)    Physical Exam  Constitutional: She is oriented to person, place, and time. She appears well-developed and well-nourished.  HENT:  Head: Normocephalic and atraumatic.  Neck: Neck supple.  Cardiovascular: Normal rate and regular rhythm.   Pulmonary/Chest: Effort normal and breath sounds normal.  Abdominal: Soft. Bowel sounds are normal. She exhibits no mass. There is no  hepatosplenomegaly. There is no tenderness.  Musculoskeletal: She exhibits no edema.  Lymphadenopathy:    She has no cervical adenopathy.  Neurological: She is alert and oriented to person, place, and time.  Skin: Skin is warm and dry.  Psychiatric: She has a normal mood and affect. Her behavior is normal.  Vitals reviewed.   Results for orders placed or performed in visit on 01/31/16  HgB A1c  Result Value Ref Range   Hgb A1c MFr Bld 8.8 (H) <5.7 %   Mean Plasma Glucose 206 mg/dL  Comprehensive Metabolic Panel (CMET)  Result Value Ref Range   Sodium 136 135 - 146 mmol/L   Potassium 3.6 3.5 - 5.3 mmol/L   Chloride 106 98 - 110 mmol/L   CO2 20 20 - 31 mmol/L   Glucose, Bld 219 (H) 65 - 99 mg/dL   BUN 7 7 - 25 mg/dL   Creat 8.650.49 (L) 7.840.50 - 1.10 mg/dL   Total Bilirubin 0.9 0.2 - 1.2 mg/dL   Alkaline Phosphatase 86 33 - 115 U/L   AST 12 10 - 35 U/L   ALT 13 6 - 29 U/L   Total Protein 7.0 6.1 - 8.1 g/dL   Albumin 3.6 3.6 - 5.1 g/dL   Calcium 8.5 (L) 8.6 - 10.2 mg/dL  Lipid Profile  Result Value Ref Range   Cholesterol 179 125 - 200 mg/dL   Triglycerides 696414 (H) <150 mg/dL   HDL 36 (L) >=29>=46 mg/dL   Total CHOL/HDL Ratio 5.0 <=5.0 Ratio   VLDL NOT CALC <30 mg/dL   LDL Cholesterol NOT CALC <130 mg/dL      Assessment & Plan:   Encounter Diagnoses  Name Primary?  Marland Kitchen. Uncontrolled type 2 diabetes mellitus with complication, unspecified long term insulin use status (HCC) Yes  . Essential hypertension, benign   . Hyperlipidemia, unspecified hyperlipidemia type   . Morbid obesity, unspecified obesity type (HCC)   . Screening for breast cancer     -reviewed labs with pt -increase insulin to 42 units bid.  Pt reminded to notify office for fbs < 70 or > 300 -order screening Mammogram -F/u 2 months with PAP at that time.  RTO sooner prn

## 2016-03-18 ENCOUNTER — Ambulatory Visit (HOSPITAL_COMMUNITY)
Admission: RE | Admit: 2016-03-18 | Discharge: 2016-03-18 | Disposition: A | Discharge status: Home / Self Care | Payer: Self-pay | Source: Ambulatory Visit | Attending: Physician Assistant | Admitting: Physician Assistant

## 2016-03-18 ENCOUNTER — Other Ambulatory Visit: Payer: Self-pay | Admitting: Physician Assistant

## 2016-03-18 DIAGNOSIS — Z1239 Encounter for other screening for malignant neoplasm of breast: Secondary | ICD-10-CM

## 2016-04-30 ENCOUNTER — Other Ambulatory Visit: Payer: Self-pay | Admitting: Student

## 2016-04-30 DIAGNOSIS — E785 Hyperlipidemia, unspecified: Secondary | ICD-10-CM

## 2016-04-30 DIAGNOSIS — I10 Essential (primary) hypertension: Secondary | ICD-10-CM

## 2016-04-30 DIAGNOSIS — E1165 Type 2 diabetes mellitus with hyperglycemia: Secondary | ICD-10-CM

## 2016-04-30 DIAGNOSIS — E118 Type 2 diabetes mellitus with unspecified complications: Principal | ICD-10-CM

## 2016-05-05 IMAGING — MG MM DIGITAL SCREENING
4 series · 4 of 4 positions shown · non-contrast
Comparison: Previous exam(s)

CLINICAL DATA: Screening.

EXAM:
DIGITAL SCREENING BILATERAL MAMMOGRAM WITH CAD

[L CC]
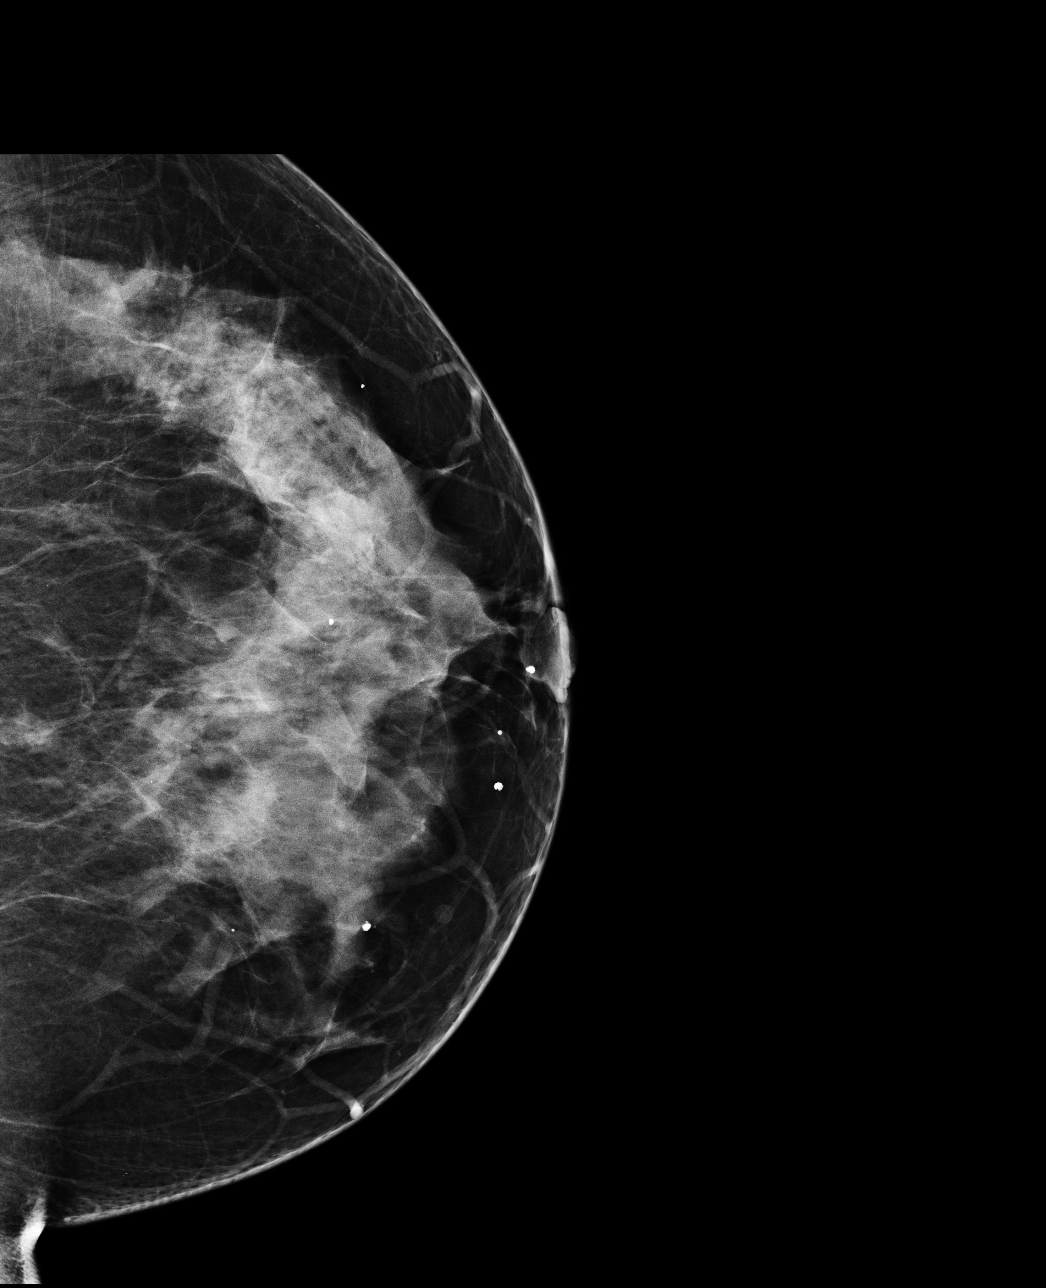

[L MLO]
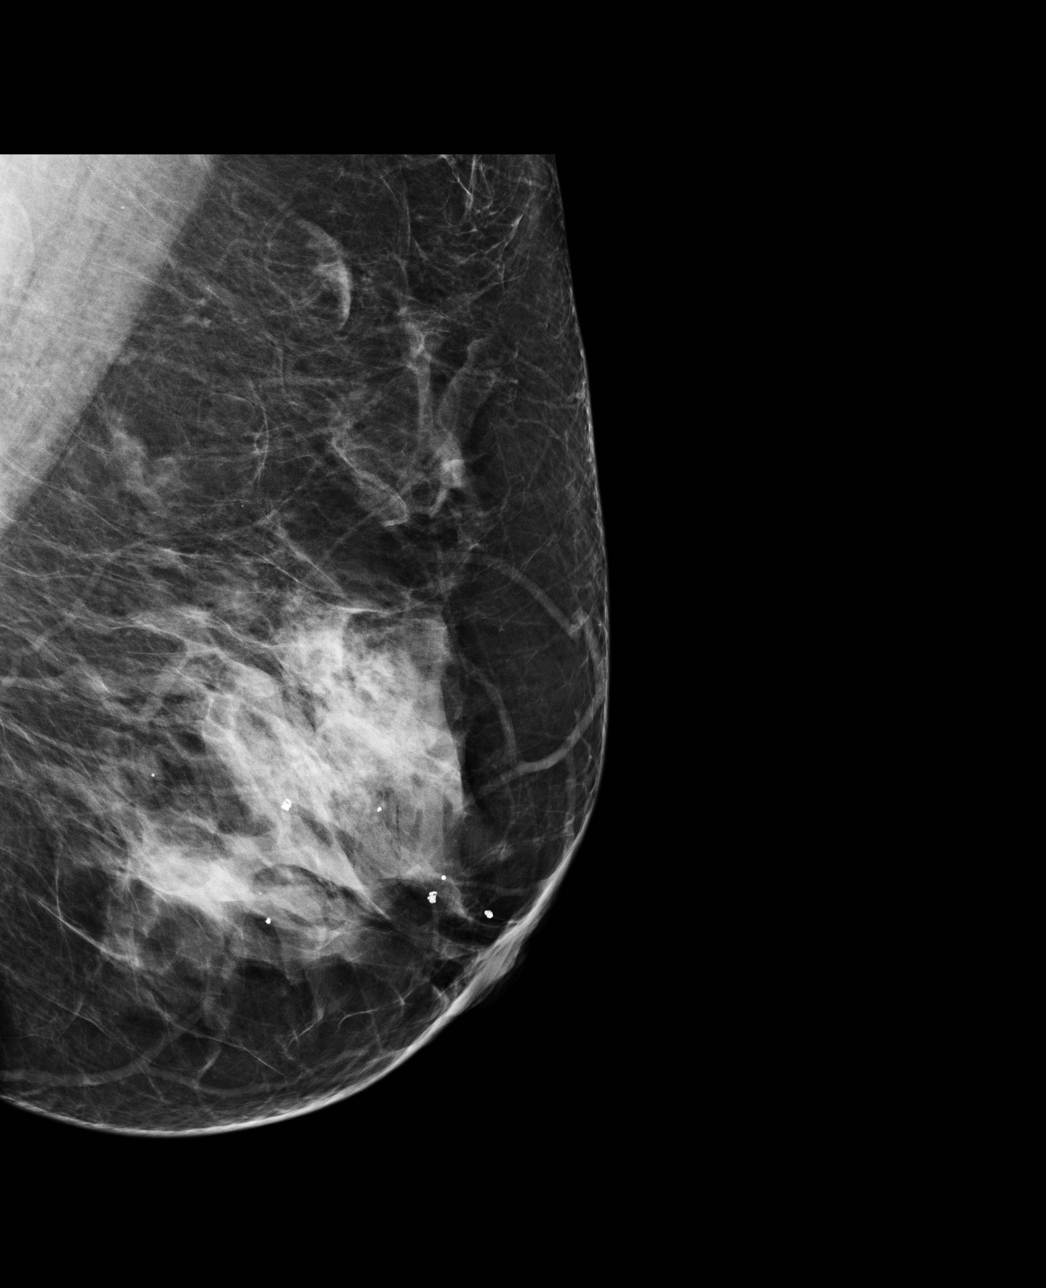

[R CC]
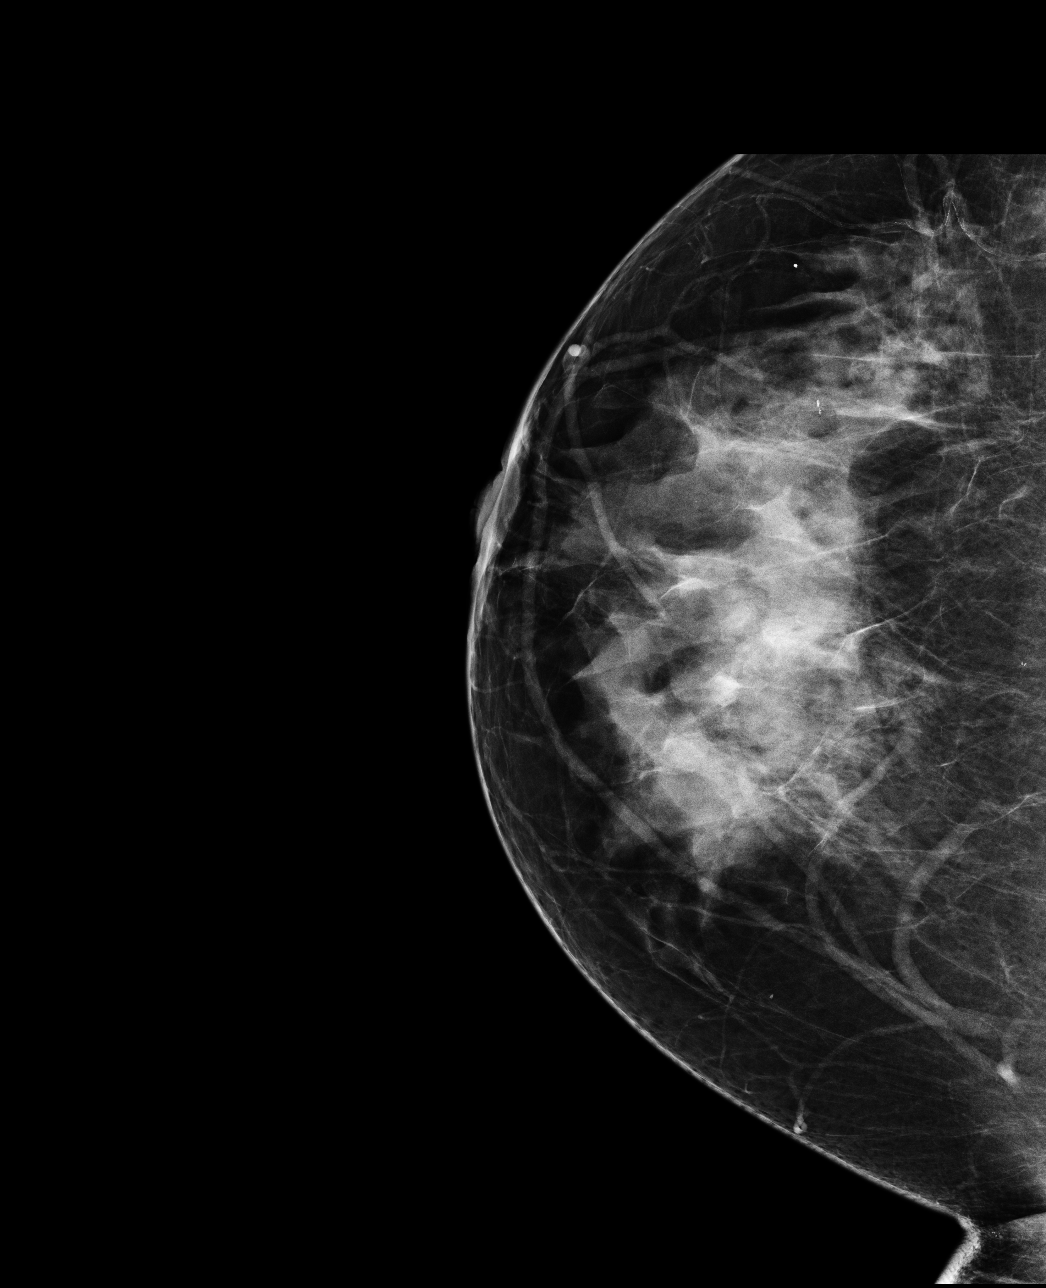

[R MLO]
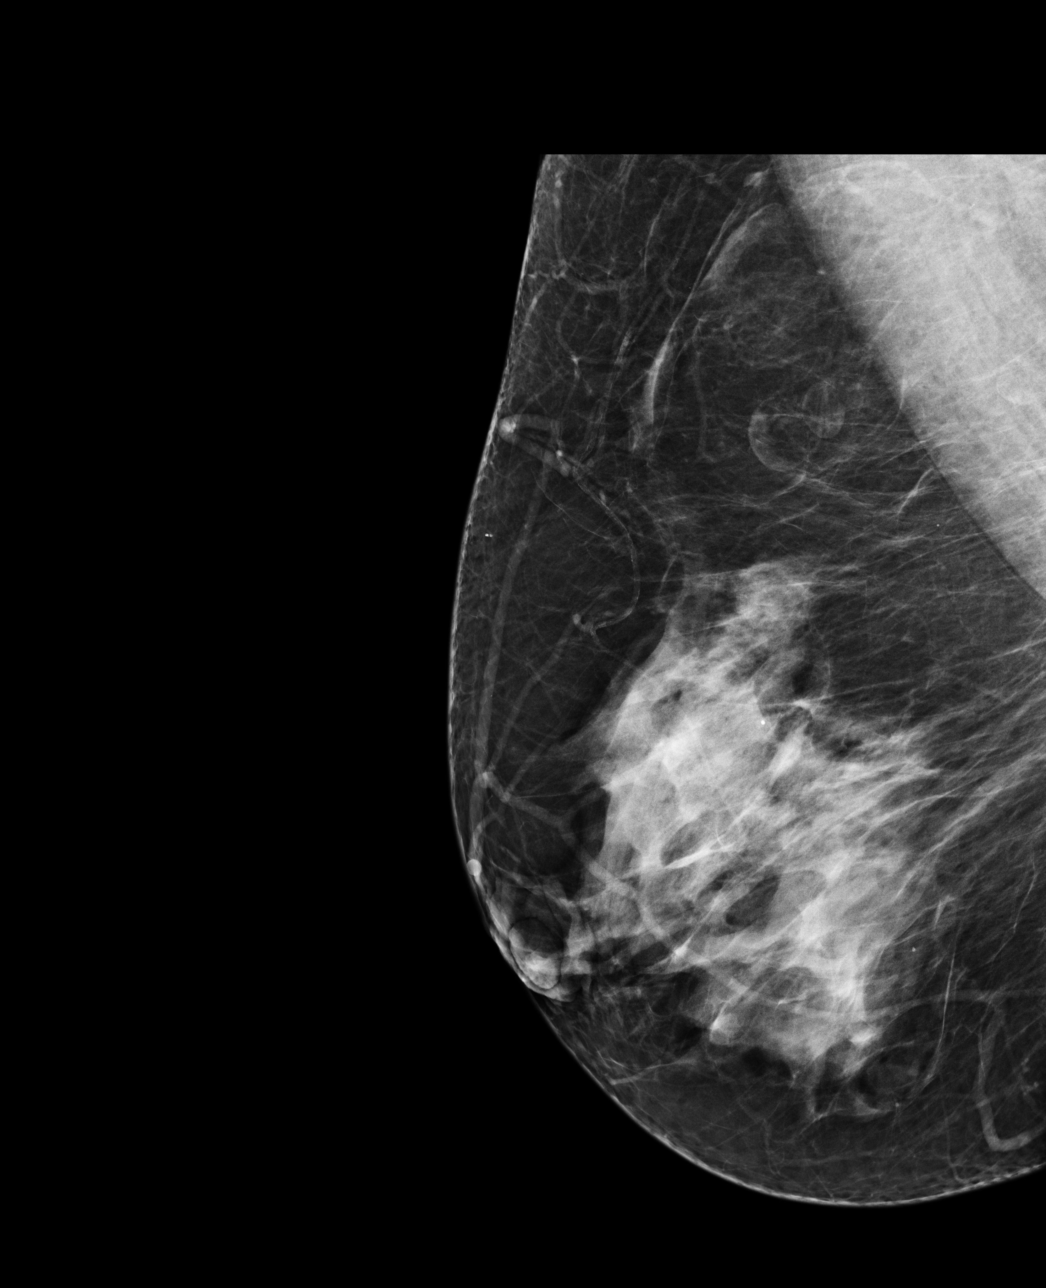

[4 of 4 positions shown; findings below may reference images not displayed]

ACR Breast Density Category c: The breast tissue is heterogeneously
dense, which may obscure small masses.
FINDINGS: In the left breast, a possible mass warrants further evaluation with
spot compression views and possibly ultrasound. In the right breast,
no findings suspicious for malignancy.

Images were processed with CAD.
IMPRESSION: Further evaluation is suggested for possible mass in the left
breast.

RECOMMENDATION:
Diagnostic mammogram and possibly ultrasound of the left breast.
(Code:PC-I-BBB)

The patient will be contacted regarding the findings, and additional
imaging will be scheduled.

BI-RADS CATEGORY  0: Incomplete. Need additional imaging evaluation
and/or prior mammograms for comparison.

## 2016-05-07 LAB — COMPREHENSIVE METABOLIC PANEL
ALK PHOS: 79 U/L (ref 33–115)
ALT: 12 U/L (ref 6–29)
AST: 12 U/L (ref 10–35)
Albumin: 3.8 g/dL (ref 3.6–5.1)
BUN: 9 mg/dL (ref 7–25)
CALCIUM: 8.7 mg/dL (ref 8.6–10.2)
CO2: 26 mmol/L (ref 20–31)
Chloride: 103 mmol/L (ref 98–110)
Creat: 0.51 mg/dL (ref 0.50–1.10)
GLUCOSE: 241 mg/dL — AB (ref 65–99)
POTASSIUM: 4 mmol/L (ref 3.5–5.3)
Sodium: 136 mmol/L (ref 135–146)
Total Bilirubin: 1.2 mg/dL (ref 0.2–1.2)
Total Protein: 6.9 g/dL (ref 6.1–8.1)

## 2016-05-07 LAB — HEMOGLOBIN A1C
HEMOGLOBIN A1C: 9.2 % — AB (ref <5.7)
Mean Plasma Glucose: 217 mg/dL

## 2016-05-07 LAB — LIPID PANEL
CHOL/HDL RATIO: 4.4 ratio (ref <5.0)
Cholesterol: 193 mg/dL (ref <200)
HDL: 44 mg/dL — AB (ref >50)
LDL CALC: 84 mg/dL (ref <100)
Triglycerides: 326 mg/dL — ABNORMAL HIGH (ref <150)
VLDL: 65 mg/dL — AB (ref <30)

## 2016-05-07 LAB — MICROALBUMIN, URINE: MICROALB UR: 32.2 mg/dL

## 2016-05-08 ENCOUNTER — Encounter: Payer: Self-pay | Admitting: Physician Assistant

## 2016-05-08 ENCOUNTER — Ambulatory Visit: Payer: Self-pay | Admitting: Physician Assistant

## 2016-05-08 VITALS — BP 118/76 | HR 80 | Temp 97.7°F | Ht 62.5 in | Wt 252.0 lb

## 2016-05-08 DIAGNOSIS — I10 Essential (primary) hypertension: Secondary | ICD-10-CM

## 2016-05-08 DIAGNOSIS — E785 Hyperlipidemia, unspecified: Secondary | ICD-10-CM

## 2016-05-08 DIAGNOSIS — Z124 Encounter for screening for malignant neoplasm of cervix: Secondary | ICD-10-CM

## 2016-05-08 DIAGNOSIS — E1165 Type 2 diabetes mellitus with hyperglycemia: Secondary | ICD-10-CM

## 2016-05-08 DIAGNOSIS — E118 Type 2 diabetes mellitus with unspecified complications: Secondary | ICD-10-CM

## 2016-05-08 NOTE — Progress Notes (Signed)
BP 118/76 (BP Location: Left Arm, Patient Position: Sitting, Cuff Size: Large)   Pulse 80   Temp 97.7 F (36.5 C)   Ht 5' 2.5" (1.588 m)   Wt 252 lb (114.3 kg)   LMP 04/28/2016 (Exact Date)   SpO2 98%   BMI 45.36 kg/m    Subjective:    Patient ID: Angelica Bentley, female    DOB: November 27, 1970, 46 y.o.   MRN: [id number]  HPI: Angelica Bentley is a 46 y.o. female presenting on 05/08/2016 for Diabetes and Gynecologic Exam   HPI   Pt is back at work at [employer].  She is feeling better.    bs log reviewed- still too high. No lows.  Relevant past medical, surgical, family and social history reviewed and updated as indicated. Interim medical history since our last visit reviewed. Allergies and medications reviewed and updated.   Current Outpatient Prescriptions:  .  aspirin 81 MG tablet, Take 81 mg by mouth daily., Disp: , Rfl:  .  fenofibrate micronized (LOFIBRA) 134 MG capsule, 1 po qd.  Tome una tableta por boca diaria, Disp: 30 capsule, Rfl: 4 .  insulin NPH-regular Human (NOVOLIN 70/30) (70-30) 100 UNIT/ML injection, 42 units sq bid before breakfast and supper.   inyecte 42 unidades subcutaneos dos veces diarios, Disp: 1 vial, Rfl: 3 .  lisinopril (PRINIVIL,ZESTRIL) 5 MG tablet, Take 1 tablet (5 mg total) by mouth daily. Tome una tableta por boca diaria, Disp: 30 tablet, Rfl: 3 .  metFORMIN (GLUCOPHAGE) 1000 MG tablet, 1 po bid for diabetes.  Tome una tableta por boca dos veces diarias con comida, Disp: 60 tablet, Rfl: 3 .  Omega-3 Fatty Acids (FISH OIL PO), Take by mouth. 4 cap daily, Disp: , Rfl:  .  simvastatin (ZOCOR) 20 MG tablet, 1 po qhs for cholesterol.  Tome una tableta por boca al dormir, Disp: 30 tablet, Rfl: 4   Review of Systems  Constitutional: Negative for appetite change, chills, diaphoresis, fatigue, fever and unexpected weight change.  HENT: Positive for dental problem. Negative for congestion, drooling, ear pain, facial swelling, hearing loss, mouth  sores, sneezing, sore throat, trouble swallowing and voice change.   Eyes: Negative for pain, discharge, redness, itching and visual disturbance.  Respiratory: Negative for cough, choking, shortness of breath and wheezing.   Cardiovascular: Negative for chest pain, palpitations and leg swelling.  Gastrointestinal: Negative for abdominal pain, blood in stool, constipation, diarrhea and vomiting.  Endocrine: Negative for cold intolerance, heat intolerance and polydipsia.  Genitourinary: Negative for decreased urine volume, dysuria and hematuria.  Musculoskeletal: Negative for arthralgias, back pain and gait problem.  Skin: Negative for rash.  Allergic/Immunologic: Negative for environmental allergies.  Neurological: Negative for seizures, syncope, light-headedness and headaches.  Hematological: Negative for adenopathy.  Psychiatric/Behavioral: Negative for agitation, dysphoric mood and suicidal ideas. The patient is not nervous/anxious.     Per HPI unless specifically indicated above     Objective:    BP 118/76 (BP Location: Left Arm, Patient Position: Sitting, Cuff Size: Large)   Pulse 80   Temp 97.7 F (36.5 C)   Ht 5' 2.5" (1.588 m)   Wt 252 lb (114.3 kg)   LMP 04/28/2016 (Exact Date)   SpO2 98%   BMI 45.36 kg/m   Wt Readings from Last 3 Encounters:  05/08/16 252 lb (114.3 kg)  03/06/16 260 lb 8 oz (118.2 kg)  12/05/15 259 lb 4 oz (117.6 kg)    Physical Exam  Constitutional: She is oriented to person,  place, and time. She appears well-developed and well-nourished.  HENT:  Head: Normocephalic and atraumatic.  Neck: Neck supple.  Cardiovascular: Normal rate and regular rhythm.   Pulmonary/Chest: Effort normal and breath sounds normal.  Breast exam normal  Abdominal: Soft. Bowel sounds are normal. She exhibits no mass. There is no hepatosplenomegaly. There is no tenderness. There is no rebound and no guarding.  Genitourinary: Vagina normal and uterus normal. No breast  swelling, tenderness, discharge or bleeding. There is no rash, tenderness or lesion on the right labia. There is no rash, tenderness or lesion on the left labia. Cervix exhibits no motion tenderness, no discharge and no friability. Right adnexum displays no mass, no tenderness and no fullness. Left adnexum displays no mass, no tenderness and no fullness.  Genitourinary Comments: (nurse Berenice assisted)  Musculoskeletal: She exhibits no edema.  Lymphadenopathy:    She has no cervical adenopathy.  Neurological: She is alert and oriented to person, place, and time.  Skin: Skin is warm and dry.  Psychiatric: She has a normal mood and affect. Her behavior is normal.  Nursing note and vitals reviewed.   Results for orders placed or performed in visit on 04/30/16  Comprehensive metabolic panel  Result Value Ref Range   Sodium 136 135 - 146 mmol/L   Potassium 4.0 3.5 - 5.3 mmol/L   Chloride 103 98 - 110 mmol/L   CO2 26 20 - 31 mmol/L   Glucose, Bld 241 (H) 65 - 99 mg/dL   BUN 9 7 - 25 mg/dL   Creat 7.840.51 6.960.50 - 2.951.10 mg/dL   Total Bilirubin 1.2 0.2 - 1.2 mg/dL   Alkaline Phosphatase 79 33 - 115 U/L   AST 12 10 - 35 U/L   ALT 12 6 - 29 U/L   Total Protein 6.9 6.1 - 8.1 g/dL   Albumin 3.8 3.6 - 5.1 g/dL   Calcium 8.7 8.6 - 28.410.2 mg/dL  Lipid panel  Result Value Ref Range   Cholesterol 193 <200 mg/dL   Triglycerides 132326 (H) <150 mg/dL   HDL 44 (L) >44>50 mg/dL   Total CHOL/HDL Ratio 4.4 <5.0 Ratio   VLDL 65 (H) <30 mg/dL   LDL Cholesterol 84 <010<100 mg/dL  Hemoglobin U7OA1c  Result Value Ref Range   Hgb A1c MFr Bld 9.2 (H) <5.7 %   Mean Plasma Glucose 217 mg/dL  Microalbumin, urine  Result Value Ref Range   Microalb, Ur 32.2 Not estab mg/dL      Assessment & Plan:   Encounter Diagnoses  Name Primary?  . Routine Papanicolaou smear Yes  . Uncontrolled type 2 diabetes mellitus with complication, unspecified long term insulin use status (HCC)   . Essential hypertension, benign   .  Hyperlipidemia, unspecified hyperlipidemia type   . Morbid obesity, unspecified obesity type (HCC)     -reviewed labs with pt -increase insulin to 45u bid.  Pt reminded to Call office for fbs < 70 or > 300 -follow up one month with bs log -pt to continue other medications without change

## 2016-06-10 ENCOUNTER — Ambulatory Visit: Payer: Self-pay | Admitting: Physician Assistant

## 2016-06-24 ENCOUNTER — Encounter: Payer: Self-pay | Admitting: Physician Assistant

## 2016-06-24 ENCOUNTER — Ambulatory Visit: Payer: Self-pay | Admitting: Physician Assistant

## 2016-06-24 VITALS — BP 120/76 | HR 78 | Temp 97.7°F | Wt 246.5 lb

## 2016-06-24 DIAGNOSIS — E118 Type 2 diabetes mellitus with unspecified complications: Principal | ICD-10-CM

## 2016-06-24 DIAGNOSIS — E1165 Type 2 diabetes mellitus with hyperglycemia: Secondary | ICD-10-CM

## 2016-06-24 NOTE — Progress Notes (Signed)
BP 120/76 (BP Location: Left Arm, Patient Position: Sitting, Cuff Size: Normal)   Pulse 78   Temp 97.7 F (36.5 C)   Wt 246 lb 8 oz (111.8 kg)   SpO2 99%   BMI 44.37 kg/m    Subjective:    Patient ID: Angelica Bentley, female    DOB: 1970-07-03, 46 y.o.   MRN: [id number]  HPI: Angelica Bentley is a 46 y.o. female presenting on 06/24/2016 for Diabetes   HPI   Reviewed bs log-= most upper 120's 130s  Relevant past medical, surgical, family and social history reviewed and updated as indicated. Interim medical history since our last visit reviewed. Allergies and medications reviewed and updated.   Current Outpatient Prescriptions:  .  aspirin 81 MG tablet, Take 81 mg by mouth daily., Disp: , Rfl:  .  fenofibrate micronized (LOFIBRA) 134 MG capsule, 1 po qd.  Tome una tableta por boca diaria, Disp: 30 capsule, Rfl: 4 .  insulin NPH-regular Human (NOVOLIN 70/30) (70-30) 100 UNIT/ML injection, 42 units sq bid before breakfast and supper.   inyecte 42 unidades subcutaneos dos veces diarios (Patient taking differently: Inject 45 Units into the skin 2 (two) times daily with a meal. 42 units sq bid before breakfast and supper.   inyecte 42 unidades subcutaneos dos veces diarios), Disp: 1 vial, Rfl: 3 .  lisinopril (PRINIVIL,ZESTRIL) 5 MG tablet, Take 1 tablet (5 mg total) by mouth daily. Tome una tableta por boca diaria, Disp: 30 tablet, Rfl: 3 .  metFORMIN (GLUCOPHAGE) 1000 MG tablet, 1 po bid for diabetes.  Tome una tableta por boca dos veces diarias con comida, Disp: 60 tablet, Rfl: 3 .  Omega-3 Fatty Acids (FISH OIL PO), Take by mouth. 4 cap daily, Disp: , Rfl:  .  simvastatin (ZOCOR) 20 MG tablet, 1 po qhs for cholesterol.  Tome una tableta por boca al dormir, Disp: 30 tablet, Rfl: 4   Review of Systems  Constitutional: Negative for appetite change, chills, diaphoresis, fatigue, fever and unexpected weight change.  HENT: Positive for dental problem. Negative for congestion,  drooling, ear pain, facial swelling, hearing loss, mouth sores, sneezing, sore throat, trouble swallowing and voice change.   Eyes: Negative for pain, discharge, redness, itching and visual disturbance.  Respiratory: Negative for cough, choking, shortness of breath and wheezing.   Cardiovascular: Negative for chest pain, palpitations and leg swelling.  Gastrointestinal: Negative for abdominal pain, blood in stool, constipation, diarrhea and vomiting.  Endocrine: Negative for cold intolerance, heat intolerance and polydipsia.  Genitourinary: Negative for decreased urine volume, dysuria and hematuria.  Musculoskeletal: Negative for arthralgias, back pain and gait problem.  Skin: Negative for rash.  Allergic/Immunologic: Negative for environmental allergies.  Neurological: Negative for seizures, syncope, light-headedness and headaches.  Hematological: Negative for adenopathy.  Psychiatric/Behavioral: Negative for agitation, dysphoric mood and suicidal ideas. The patient is not nervous/anxious.     Per HPI unless specifically indicated above     Objective:    BP 120/76 (BP Location: Left Arm, Patient Position: Sitting, Cuff Size: Normal)   Pulse 78   Temp 97.7 F (36.5 C)   Wt 246 lb 8 oz (111.8 kg)   SpO2 99%   BMI 44.37 kg/m   Wt Readings from Last 3 Encounters:  06/24/16 246 lb 8 oz (111.8 kg)  05/08/16 252 lb (114.3 kg)  03/06/16 260 lb 8 oz (118.2 kg)    Physical Exam  Constitutional: She is oriented to person, place, and time. She appears well-developed and well-nourished.  HENT:  Head: Normocephalic and atraumatic.  Neck: Neck supple.  Cardiovascular: Normal rate and regular rhythm.   Pulmonary/Chest: Effort normal and breath sounds normal.  Abdominal: Soft. Bowel sounds are normal. She exhibits no mass. There is no hepatosplenomegaly. There is no tenderness.  Musculoskeletal: She exhibits no edema.  Lymphadenopathy:    She has no cervical adenopathy.  Neurological: She  is alert and oriented to person, place, and time.  Skin: Skin is warm and dry.  Psychiatric: She has a normal mood and affect. Her behavior is normal.  Vitals reviewed.       Assessment & Plan:   Encounter Diagnosis  Name Primary?  Marland Kitchen. Uncontrolled type 2 diabetes mellitus with complication, unspecified long term insulin use status (HCC) Yes    Increase insulin to 50 units Follow up 1 month with bs log.  RTO sooner prn

## 2016-07-21 ENCOUNTER — Other Ambulatory Visit: Payer: Self-pay | Admitting: Physician Assistant

## 2016-07-22 ENCOUNTER — Ambulatory Visit: Payer: Self-pay | Admitting: Physician Assistant

## 2016-07-22 ENCOUNTER — Encounter: Payer: Self-pay | Admitting: Physician Assistant

## 2016-07-22 VITALS — BP 134/76 | HR 70 | Temp 97.3°F | Ht 62.5 in | Wt 252.0 lb

## 2016-07-22 DIAGNOSIS — I10 Essential (primary) hypertension: Secondary | ICD-10-CM

## 2016-07-22 DIAGNOSIS — E1165 Type 2 diabetes mellitus with hyperglycemia: Secondary | ICD-10-CM

## 2016-07-22 DIAGNOSIS — E785 Hyperlipidemia, unspecified: Secondary | ICD-10-CM

## 2016-07-22 DIAGNOSIS — E118 Type 2 diabetes mellitus with unspecified complications: Principal | ICD-10-CM

## 2016-07-22 LAB — LIPID PANEL
CHOL/HDL RATIO: 4.9 ratio (ref <5.0)
CHOLESTEROL: 170 mg/dL (ref <200)
HDL: 35 mg/dL — ABNORMAL LOW (ref >50)
LDL CALC: 65 mg/dL (ref <100)
Triglycerides: 349 mg/dL — ABNORMAL HIGH (ref <150)
VLDL: 70 mg/dL — AB (ref <30)

## 2016-07-22 NOTE — Progress Notes (Signed)
BP 134/76 (BP Location: Left Arm, Patient Position: Sitting, Cuff Size: Large)   Pulse 70   Temp 97.3 F (36.3 C)   Ht 5' 2.5" (1.588 m)   Wt 252 lb (114.3 kg)   SpO2 99%   BMI 45.36 kg/m    Subjective:    Patient ID: Angelica Bentley, female    DOB: September 21, 1970, 46 y.o.   MRN: [id number]  HPI: Angelica Bentley is a 46 y.o. female presenting on 07/22/2016 for Diabetes (pt got labs drawn this morning)   HPI   Pt doing well.   She just got labs drawn this morning so those results are not available.  bs log 110-140.  Relevant past medical, surgical, family and social history reviewed and updated as indicated. Interim medical history since our last visit reviewed. Allergies and medications reviewed and updated.   Current Outpatient Prescriptions:  .  aspirin 81 MG tablet, Take 81 mg by mouth daily., Disp: , Rfl:  .  fenofibrate micronized (LOFIBRA) 134 MG capsule, 1 po qd.  Tome una tableta por boca diaria, Disp: 30 capsule, Rfl: 4 .  insulin NPH-regular Human (NOVOLIN 70/30) (70-30) 100 UNIT/ML injection, 42 units sq bid before breakfast and supper.   inyecte 42 unidades subcutaneos dos veces diarios (Patient taking differently: Inject 50 Units into the skin 2 (two) times daily with a meal. 42 units sq bid before breakfast and supper.   inyecte 42 unidades subcutaneos dos veces diarios), Disp: 1 vial, Rfl: 3 .  lisinopril (PRINIVIL,ZESTRIL) 5 MG tablet, Take 1 tablet (5 mg total) by mouth daily. Tome una tableta por boca diaria, Disp: 30 tablet, Rfl: 3 .  metFORMIN (GLUCOPHAGE) 1000 MG tablet, 1 po bid for diabetes.  Tome una tableta por boca dos veces diarias con comida, Disp: 60 tablet, Rfl: 3 .  Omega-3 Fatty Acids (FISH OIL PO), Take by mouth. 4 cap daily, Disp: , Rfl:  .  simvastatin (ZOCOR) 20 MG tablet, 1 po qhs for cholesterol.  Tome una tableta por boca al dormir, Disp: 30 tablet, Rfl: 4   Review of Systems  Constitutional: Negative for appetite change, chills,  diaphoresis, fatigue, fever and unexpected weight change.  HENT: Positive for dental problem. Negative for congestion, drooling, ear pain, facial swelling, hearing loss, mouth sores, sneezing, sore throat, trouble swallowing and voice change.   Eyes: Positive for redness. Negative for pain, discharge, itching and visual disturbance.  Respiratory: Negative for cough, choking, shortness of breath and wheezing.   Cardiovascular: Negative for chest pain, palpitations and leg swelling.  Gastrointestinal: Negative for abdominal pain, blood in stool, constipation, diarrhea and vomiting.  Endocrine: Negative for cold intolerance, heat intolerance and polydipsia.  Genitourinary: Negative for decreased urine volume, dysuria and hematuria.  Musculoskeletal: Negative for arthralgias, back pain and gait problem.  Skin: Negative for rash.  Allergic/Immunologic: Negative for environmental allergies.  Neurological: Negative for seizures, syncope, light-headedness and headaches.  Hematological: Negative for adenopathy.  Psychiatric/Behavioral: Negative for agitation, dysphoric mood and suicidal ideas. The patient is not nervous/anxious.     Per HPI unless specifically indicated above     Objective:    BP 134/76 (BP Location: Left Arm, Patient Position: Sitting, Cuff Size: Large)   Pulse 70   Temp 97.3 F (36.3 C)   Ht 5' 2.5" (1.588 m)   Wt 252 lb (114.3 kg)   SpO2 99%   BMI 45.36 kg/m   Wt Readings from Last 3 Encounters:  07/22/16 252 lb (114.3 kg)  06/24/16 246  lb 8 oz (111.8 kg)  05/08/16 252 lb (114.3 kg)    Physical Exam  Constitutional: She is oriented to person, place, and time. She appears well-developed and well-nourished.  HENT:  Head: Normocephalic and atraumatic.  Neck: Neck supple.  Cardiovascular: Normal rate and regular rhythm.   Pulmonary/Chest: Effort normal and breath sounds normal.  Abdominal: Soft. Bowel sounds are normal. She exhibits no mass. There is no  hepatosplenomegaly. There is no tenderness.  Musculoskeletal: She exhibits no edema.  Lymphadenopathy:    She has no cervical adenopathy.  Neurological: She is alert and oriented to person, place, and time.  Skin: Skin is warm and dry.  Psychiatric: She has a normal mood and affect. Her behavior is normal.  Vitals reviewed.        Assessment & Plan:    Encounter Diagnoses  Name Primary?  Marland Kitchen Uncontrolled type 2 diabetes mellitus with complication, unspecified whether long term insulin use (HCC) Yes  . Essential hypertension, benign   . Hyperlipidemia, unspecified hyperlipidemia type   . Morbid obesity, unspecified obesity type (HCC)     Pt to continue current meds for now.  Will call her with lab results Follow up 3 months.  RTO sooner prn

## 2016-07-22 NOTE — Patient Instructions (Signed)
Hydrocortisone cream as needed for rash.

## 2016-07-23 LAB — MICROALBUMIN, URINE: Microalb, Ur: 13.5 mg/dL

## 2016-07-23 LAB — HEMOGLOBIN A1C
Hgb A1c MFr Bld: 8.7 % — ABNORMAL HIGH (ref <5.7)
Mean Plasma Glucose: 203 mg/dL

## 2016-08-19 ENCOUNTER — Ambulatory Visit: Payer: Self-pay | Admitting: Physician Assistant

## 2016-08-20 ENCOUNTER — Ambulatory Visit: Payer: Self-pay | Admitting: Physician Assistant

## 2016-08-20 ENCOUNTER — Encounter: Payer: Self-pay | Admitting: Physician Assistant

## 2016-08-20 ENCOUNTER — Other Ambulatory Visit (HOSPITAL_COMMUNITY)
Admission: RE | Admit: 2016-08-20 | Discharge: 2016-08-20 | Disposition: A | Discharge status: Home / Self Care | Payer: PRIVATE HEALTH INSURANCE | Source: Ambulatory Visit | Attending: Physician Assistant | Admitting: Physician Assistant

## 2016-08-20 VITALS — BP 126/68 | HR 80 | Temp 98.1°F | Ht 62.5 in | Wt 248.5 lb

## 2016-08-20 DIAGNOSIS — Z124 Encounter for screening for malignant neoplasm of cervix: Secondary | ICD-10-CM | POA: Insufficient documentation

## 2016-08-20 DIAGNOSIS — E1165 Type 2 diabetes mellitus with hyperglycemia: Secondary | ICD-10-CM

## 2016-08-20 DIAGNOSIS — I10 Essential (primary) hypertension: Secondary | ICD-10-CM

## 2016-08-20 DIAGNOSIS — R21 Rash and other nonspecific skin eruption: Secondary | ICD-10-CM

## 2016-08-20 DIAGNOSIS — E118 Type 2 diabetes mellitus with unspecified complications: Principal | ICD-10-CM

## 2016-08-20 DIAGNOSIS — E785 Hyperlipidemia, unspecified: Secondary | ICD-10-CM

## 2016-08-20 MED ORDER — TRIAMCINOLONE ACETONIDE 0.1 % EX OINT: TOPICAL_OINTMENT | CUTANEOUS | 0 refills | Status: DC

## 2016-08-20 NOTE — Progress Notes (Signed)
BP 126/68 (BP Location: Left Arm, Patient Position: Sitting, Cuff Size: Large)   Pulse 80   Temp 98.1 F (36.7 C) (Other (Comment))   Ht 5' 2.5" (1.588 m)   Wt 248 lb 8 oz (112.7 kg)   LMP 08/13/2016 (Exact Date)   SpO2 97%   BMI 44.73 kg/m    Subjective:    Patient ID: Angelica Bentley, female    DOB: 06/16/1970, 46 y.o.   MRN: [id number]  HPI: Angelica Bentley is a 46 y.o. female presenting on 08/20/2016 for Diabetes; Hyperlipidemia; and Gynecologic Exam   HPI   Pt did not bring her bs log with her.  Today it was 130.   She thinks highest was 148.    Pt states still has rash R ankle.   Pt says I gave her ointment last time she was here but I did not.   Pt PAP needs to be repeated due to problem with lab and her sample.   Relevant past medical, surgical, family and social history reviewed and updated as indicated. Interim medical history since our last visit reviewed. Allergies and medications reviewed and updated.   Current Outpatient Prescriptions:  .  aspirin 81 MG tablet, Take 81 mg by mouth daily., Disp: , Rfl:  .  fenofibrate micronized (LOFIBRA) 134 MG capsule, 1 po qd.  Tome una tableta por boca diaria, Disp: 30 capsule, Rfl: 4 .  insulin NPH-regular Human (NOVOLIN 70/30) (70-30) 100 UNIT/ML injection, 42 units sq bid before breakfast and supper.   inyecte 42 unidades subcutaneos dos veces diarios (Patient taking differently: Inject 55 Units into the skin 2 (two) times daily with a meal. 55 units sq bid before breakfast and supper.   inyecte 55 unidades subcutaneos dos veces diarios), Disp: 1 vial, Rfl: 3 .  lisinopril (PRINIVIL,ZESTRIL) 5 MG tablet, Take 1 tablet (5 mg total) by mouth daily. Tome una tableta por boca diaria, Disp: 30 tablet, Rfl: 3 .  metFORMIN (GLUCOPHAGE) 1000 MG tablet, 1 po bid for diabetes. Tome una tableta por boca dos veces diarias con comida, Disp: 60 tablet, Rfl: 4 .  Omega-3 Fatty Acids (FISH OIL PO), Take by mouth. 4 cap daily, Disp:  , Rfl:  .  simvastatin (ZOCOR) 20 MG tablet, 1 po qhs for cholesterol.  Tome una tableta por boca al dormir, Disp: 30 tablet, Rfl: 4   Review of Systems  Constitutional: Negative for appetite change, chills, diaphoresis, fatigue, fever and unexpected weight change.  HENT: Positive for dental problem. Negative for congestion, drooling, ear pain, facial swelling, hearing loss, mouth sores, sneezing, sore throat, trouble swallowing and voice change.   Eyes: Negative for pain, discharge, redness, itching and visual disturbance.  Respiratory: Negative for cough, choking, shortness of breath and wheezing.   Cardiovascular: Negative for chest pain, palpitations and leg swelling.  Gastrointestinal: Negative for abdominal pain, blood in stool, constipation, diarrhea and vomiting.  Endocrine: Negative for cold intolerance, heat intolerance and polydipsia.  Genitourinary: Negative for decreased urine volume, dysuria and hematuria.  Musculoskeletal: Negative for arthralgias, back pain and gait problem.  Skin: Negative for rash.  Allergic/Immunologic: Negative for environmental allergies.  Neurological: Negative for seizures, syncope, light-headedness and headaches.  Hematological: Negative for adenopathy.  Psychiatric/Behavioral: Negative for agitation, dysphoric mood and suicidal ideas. The patient is not nervous/anxious.     Per HPI unless specifically indicated above     Objective:    BP 126/68 (BP Location: Left Arm, Patient Position: Sitting, Cuff Size: Large)   Pulse  80   Temp 98.1 F (36.7 C) (Other (Comment))   Ht 5' 2.5" (1.588 m)   Wt 248 lb 8 oz (112.7 kg)   LMP 08/13/2016 (Exact Date)   SpO2 97%   BMI 44.73 kg/m   Wt Readings from Last 3 Encounters:  08/20/16 248 lb 8 oz (112.7 kg)  07/22/16 252 lb (114.3 kg)  06/24/16 246 lb 8 oz (111.8 kg)    Physical Exam  Constitutional: She is oriented to person, place, and time. She appears well-developed and well-nourished.  HENT:   Head: Normocephalic and atraumatic.  Neck: Neck supple.  Cardiovascular: Normal rate and regular rhythm.   Pulmonary/Chest: Effort normal and breath sounds normal.  Breast exam normal  Abdominal: Soft. Bowel sounds are normal. She exhibits no mass. There is no hepatosplenomegaly. There is no tenderness. There is no rebound and no guarding.  Genitourinary: Vagina normal and uterus normal. No breast swelling, tenderness, discharge or bleeding. There is no rash, tenderness or lesion on the right labia. There is no rash, tenderness or lesion on the left labia. Cervix exhibits no motion tenderness, no discharge and no friability. Right adnexum displays no mass, no tenderness and no fullness. Left adnexum displays no mass, no tenderness and no fullness.  Musculoskeletal: She exhibits no edema.  Lymphadenopathy:    She has no cervical adenopathy.  Neurological: She is alert and oriented to person, place, and time.  Skin: Skin is warm and dry. Rash (R ankle.  flat, reddish spots. about 2 inch diameter) noted.  Psychiatric: She has a normal mood and affect. Her behavior is normal.  Nursing note and vitals reviewed.   Results for orders placed or performed in visit on 04/30/16  Hemoglobin A1c  Result Value Ref Range   Hgb A1c MFr Bld 8.7 (H) <5.7 %   Mean Plasma Glucose 203 mg/dL  Microalbumin, urine  Result Value Ref Range   Microalb, Ur 13.5 Not estab mg/dL  Lipid panel  Result Value Ref Range   Cholesterol 170 <200 mg/dL   Triglycerides 161 (H) <150 mg/dL   HDL 35 (L) >09 mg/dL   Total CHOL/HDL Ratio 4.9 <5.0 Ratio   VLDL 70 (H) <30 mg/dL   LDL Cholesterol 65 <604 mg/dL  Comprehensive metabolic panel  Result Value Ref Range   Sodium 136 135 - 146 mmol/L   Potassium 4.0 3.5 - 5.3 mmol/L   Chloride 103 98 - 110 mmol/L   CO2 26 20 - 31 mmol/L   Glucose, Bld 241 (H) 65 - 99 mg/dL   BUN 9 7 - 25 mg/dL   Creat 5.40 9.81 - 1.91 mg/dL   Total Bilirubin 1.2 0.2 - 1.2 mg/dL   Alkaline  Phosphatase 79 33 - 115 U/L   AST 12 10 - 35 U/L   ALT 12 6 - 29 U/L   Total Protein 6.9 6.1 - 8.1 g/dL   Albumin 3.8 3.6 - 5.1 g/dL   Calcium 8.7 8.6 - 47.8 mg/dL  Lipid panel  Result Value Ref Range   Cholesterol 193 <200 mg/dL   Triglycerides 295 (H) <150 mg/dL   HDL 44 (L) >62 mg/dL   Total CHOL/HDL Ratio 4.4 <5.0 Ratio   VLDL 65 (H) <30 mg/dL   LDL Cholesterol 84 <130 mg/dL  Hemoglobin Q6V  Result Value Ref Range   Hgb A1c MFr Bld 9.2 (H) <5.7 %   Mean Plasma Glucose 217 mg/dL  Microalbumin, urine  Result Value Ref Range   Microalb, Ur 32.2 Not  estab mg/dL      Assessment & Plan:   Encounter Diagnoses  Name Primary?  Marland Kitchen Uncontrolled type 2 diabetes mellitus with complication, unspecified whether long term insulin use (HCC) Yes  . Essential hypertension, benign   . Routine Papanicolaou smear   . Rash   . Hyperlipidemia, unspecified hyperlipidemia type   . Morbid obesity, unspecified obesity type (HCC)      -pt to Cont current insulin. Watch diabetic diet -rx TAC cream for ankle rash -F/u 2 months. RTO sooner prn

## 2016-09-16 ENCOUNTER — Encounter: Payer: Self-pay | Admitting: Physician Assistant

## 2016-09-17 ENCOUNTER — Other Ambulatory Visit: Payer: Self-pay | Admitting: Physician Assistant

## 2016-09-17 DIAGNOSIS — E1165 Type 2 diabetes mellitus with hyperglycemia: Secondary | ICD-10-CM

## 2016-09-17 DIAGNOSIS — E785 Hyperlipidemia, unspecified: Secondary | ICD-10-CM

## 2016-09-17 DIAGNOSIS — I10 Essential (primary) hypertension: Secondary | ICD-10-CM

## 2016-09-17 DIAGNOSIS — E118 Type 2 diabetes mellitus with unspecified complications: Principal | ICD-10-CM

## 2016-09-24 NOTE — Telephone Encounter (Signed)
Error

## 2016-10-15 ENCOUNTER — Other Ambulatory Visit (HOSPITAL_COMMUNITY)
Admission: RE | Admit: 2016-10-15 | Discharge: 2016-10-15 | Disposition: A | Discharge status: Home / Self Care | Payer: PRIVATE HEALTH INSURANCE | Source: Ambulatory Visit | Attending: Physician Assistant | Admitting: Physician Assistant

## 2016-10-15 DIAGNOSIS — I10 Essential (primary) hypertension: Secondary | ICD-10-CM

## 2016-10-15 DIAGNOSIS — E1165 Type 2 diabetes mellitus with hyperglycemia: Secondary | ICD-10-CM | POA: Insufficient documentation

## 2016-10-15 DIAGNOSIS — E785 Hyperlipidemia, unspecified: Secondary | ICD-10-CM

## 2016-10-15 DIAGNOSIS — E118 Type 2 diabetes mellitus with unspecified complications: Secondary | ICD-10-CM | POA: Insufficient documentation

## 2016-10-15 LAB — COMPREHENSIVE METABOLIC PANEL
ALK PHOS: 85 U/L (ref 38–126)
ALT: 16 U/L (ref 14–54)
ANION GAP: 10 (ref 5–15)
AST: 14 U/L — ABNORMAL LOW (ref 15–41)
Albumin: 3.7 g/dL (ref 3.5–5.0)
BILIRUBIN TOTAL: 1.2 mg/dL (ref 0.3–1.2)
BUN: 10 mg/dL (ref 6–20)
CALCIUM: 8.8 mg/dL — AB (ref 8.9–10.3)
CO2: 24 mmol/L (ref 22–32)
CREATININE: 0.43 mg/dL — AB (ref 0.44–1.00)
Chloride: 101 mmol/L (ref 101–111)
GFR calc Af Amer: >60 mL/min (ref >60)
GFR calc non Af Amer: >60 mL/min (ref >60)
GLUCOSE: 241 mg/dL — AB (ref 65–99)
Potassium: 3.5 mmol/L (ref 3.5–5.1)
Sodium: 135 mmol/L (ref 135–145)
TOTAL PROTEIN: 7.4 g/dL (ref 6.5–8.1)

## 2016-10-15 LAB — LIPID PANEL
CHOLESTEROL: 194 mg/dL (ref 0–200)
HDL: 43 mg/dL (ref >40)
LDL CALC: UNDETERMINED mg/dL (ref 0–99)
TRIGLYCERIDES: 407 mg/dL — AB (ref <150)
Total CHOL/HDL Ratio: 4.5 RATIO
VLDL: UNDETERMINED mg/dL (ref 0–40)

## 2016-10-16 LAB — HEMOGLOBIN A1C
Hgb A1c MFr Bld: 9.4 % — ABNORMAL HIGH (ref 4.8–5.6)
Mean Plasma Glucose: 223 mg/dL

## 2016-10-21 ENCOUNTER — Ambulatory Visit: Payer: Self-pay | Admitting: Physician Assistant

## 2016-10-22 ENCOUNTER — Ambulatory Visit: Payer: Self-pay | Admitting: Physician Assistant

## 2016-10-29 ENCOUNTER — Encounter: Payer: Self-pay | Admitting: Physician Assistant

## 2016-11-05 ENCOUNTER — Ambulatory Visit: Payer: Self-pay | Admitting: Physician Assistant

## 2016-11-05 ENCOUNTER — Encounter: Payer: Self-pay | Admitting: Physician Assistant

## 2016-11-05 VITALS — BP 116/74 | HR 90 | Temp 97.9°F | Ht 62.5 in | Wt 243.5 lb

## 2016-11-05 DIAGNOSIS — E1165 Type 2 diabetes mellitus with hyperglycemia: Secondary | ICD-10-CM

## 2016-11-05 DIAGNOSIS — E118 Type 2 diabetes mellitus with unspecified complications: Principal | ICD-10-CM

## 2016-11-05 DIAGNOSIS — I10 Essential (primary) hypertension: Secondary | ICD-10-CM

## 2016-11-05 DIAGNOSIS — E785 Hyperlipidemia, unspecified: Secondary | ICD-10-CM

## 2016-11-05 NOTE — Progress Notes (Signed)
BP 116/74 (BP Location: Left Arm, Patient Position: Sitting, Cuff Size: Large)   Pulse 90   Temp 97.9 F (36.6 C) (Other (Comment))   Ht 5' 2.5" (1.588 m)   Wt 243 lb 8 oz (110.5 kg)   LMP 10/10/2016 (Exact Date)   SpO2 98%   BMI 43.83 kg/m    Subjective:    Patient ID: Angelica Bentley, female    DOB: 11/10/1970, 46 y.o.   MRN: [id number]  HPI: Angelica Bentley is a 46 y.o. female presenting on 11/05/2016 for Follow-up   HPI    Pt says she is walking 3 or 4 times/week  she is watching what she eats, she says  She feels well    Relevant past medical, surgical, family and social history reviewed and updated as indicated. Interim medical history since our last visit reviewed. Allergies and medications reviewed and updated.   Current Outpatient Prescriptions:  .  aspirin 81 MG tablet, Take 81 mg by mouth daily., Disp: , Rfl:  .  fenofibrate micronized (LOFIBRA) 134 MG capsule, 1 po qd.  Tome una tableta por boca diaria, Disp: 30 capsule, Rfl: 4 .  insulin NPH-regular Human (NOVOLIN 70/30) (70-30) 100 UNIT/ML injection, 42 units sq bid before breakfast and supper.   inyecte 42 unidades subcutaneos dos veces diarios (Patient taking differently: Inject 65 Units into the skin 2 (two) times daily with a meal. 65nits sq bid before breakfast and supper.   inyecte 65 unidades subcutaneos dos veces diarios), Disp: 1 vial, Rfl: 3 .  lisinopril (PRINIVIL,ZESTRIL) 5 MG tablet, Take 1 tablet (5 mg total) by mouth daily. Tome una tableta por boca diaria, Disp: 30 tablet, Rfl: 3 .  metFORMIN (GLUCOPHAGE) 1000 MG tablet, 1 po bid for diabetes. Tome una tableta por boca dos veces diarias con comida, Disp: 60 tablet, Rfl: 4 .  Omega-3 Fatty Acids (FISH OIL PO), Take by mouth. 4 cap daily, Disp: , Rfl:  .  simvastatin (ZOCOR) 20 MG tablet, 1 po qhs for cholesterol.  Tome una tableta por boca al dormir, Disp: 30 tablet, Rfl: 4 .  triamcinolone ointment (KENALOG) 0.1 %, Apply to affected area  bid prn.  aplique a la area Motorolaafectada dos veces al dia cuando sea necesario, Disp: 15 g, Rfl: 0   Review of Systems  Constitutional: Negative for appetite change, chills, diaphoresis, fatigue, fever and unexpected weight change.  HENT: Negative for congestion, dental problem, drooling, ear pain, facial swelling, hearing loss, mouth sores, sneezing, sore throat, trouble swallowing and voice change.   Eyes: Negative for pain, discharge, redness, itching and visual disturbance.  Respiratory: Negative for cough, choking, shortness of breath and wheezing.   Cardiovascular: Negative for chest pain, palpitations and leg swelling.  Gastrointestinal: Negative for abdominal pain, blood in stool, constipation, diarrhea and vomiting.  Endocrine: Negative for cold intolerance, heat intolerance and polydipsia.  Genitourinary: Negative for decreased urine volume, dysuria and hematuria.  Musculoskeletal: Negative for arthralgias, back pain and gait problem.  Skin: Negative for rash.  Allergic/Immunologic: Negative for environmental allergies.  Neurological: Negative for seizures, syncope, light-headedness and headaches.  Hematological: Negative for adenopathy.  Psychiatric/Behavioral: Negative for agitation, dysphoric mood and suicidal ideas. The patient is not nervous/anxious.        Per HPI unless specifically indicated above     Objective:    BP 116/74 (BP Location: Left Arm, Patient Position: Sitting, Cuff Size: Large)   Pulse 90   Temp 97.9 F (36.6 C) (Other (Comment))   Ht  5' 2.5" (1.588 m)   Wt 243 lb 8 oz (110.5 kg)   LMP 10/10/2016 (Exact Date)   SpO2 98%   BMI 43.83 kg/m   Wt Readings from Last 3 Encounters:  11/05/16 243 lb 8 oz (110.5 kg)  08/20/16 248 lb 8 oz (112.7 kg)  07/22/16 252 lb (114.3 kg)    Physical Exam  Constitutional: She is oriented to person, place, and time. She appears well-developed and well-nourished.  HENT:  Head: Normocephalic and atraumatic.  Neck:  Neck supple.  Cardiovascular: Normal rate and regular rhythm.   Pulmonary/Chest: Effort normal and breath sounds normal.  Abdominal: Soft. Bowel sounds are normal. She exhibits no mass. There is no hepatosplenomegaly. There is no tenderness.  Musculoskeletal: She exhibits no edema.  Lymphadenopathy:    She has no cervical adenopathy.  Neurological: She is alert and oriented to person, place, and time.  Skin: Skin is warm and dry.  Psychiatric: She has a normal mood and affect. Her behavior is normal.  Vitals reviewed.   Results for orders placed or performed during the hospital encounter of 10/15/16  Comprehensive metabolic panel  Result Value Ref Range   Sodium 135 135 - 145 mmol/L   Potassium 3.5 3.5 - 5.1 mmol/L   Chloride 101 101 - 111 mmol/L   CO2 24 22 - 32 mmol/L   Glucose, Bld 241 (H) 65 - 99 mg/dL   BUN 10 6 - 20 mg/dL   Creatinine, Ser 1.02 (L) 0.44 - 1.00 mg/dL   Calcium 8.8 (L) 8.9 - 10.3 mg/dL   Total Protein 7.4 6.5 - 8.1 g/dL   Albumin 3.7 3.5 - 5.0 g/dL   AST 14 (L) 15 - 41 U/L   ALT 16 14 - 54 U/L   Alkaline Phosphatase 85 38 - 126 U/L   Total Bilirubin 1.2 0.3 - 1.2 mg/dL   GFR calc non Af Amer >60 >60 mL/min   GFR calc Af Amer >60 >60 mL/min   Anion gap 10 5 - 15  Lipid panel  Result Value Ref Range   Cholesterol 194 0 - 200 mg/dL   Triglycerides 725 (H) <150 mg/dL   HDL 43 >36 mg/dL   Total CHOL/HDL Ratio 4.5 RATIO   VLDL UNABLE TO CALCULATE IF TRIGLYCERIDE OVER 400 mg/dL 0 - 40 mg/dL   LDL Cholesterol UNABLE TO CALCULATE IF TRIGLYCERIDE OVER 400 mg/dL 0 - 99 mg/dL  Hemoglobin U4Q  Result Value Ref Range   Hgb A1c MFr Bld 9.4 (H) 4.8 - 5.6 %   Mean Plasma Glucose 223 mg/dL      Assessment & Plan:   Encounter Diagnoses  Name Primary?  Marland Kitchen Uncontrolled type 2 diabetes mellitus with complication, unspecified whether long term insulin use (HCC) Yes  . Essential hypertension, benign   . Hyperlipidemia, unspecified hyperlipidemia type   . Morbid  obesity, unspecified obesity type (HCC)     -reviewed labs with pt -will Refer for annual diabetic eye exam  Pt preferes eye appt on a tuesday -will Increase insulin to 70 u bid and continue metformin. Reminded pt to call office for fbs < 70 or > 300 -counseled pt about maintaining good diabetic diet -Offered nutritiionist- will refer if she an becan be seen on a tuesday -counseled pt to Watch lowfat diet. Increase exercise to at least 5 day/week.    -will follow up in 3 months.  Pt to RTO sooner prn

## 2016-11-06 ENCOUNTER — Other Ambulatory Visit: Payer: Self-pay | Admitting: Physician Assistant

## 2016-11-06 DIAGNOSIS — E785 Hyperlipidemia, unspecified: Secondary | ICD-10-CM

## 2016-11-06 DIAGNOSIS — E1165 Type 2 diabetes mellitus with hyperglycemia: Secondary | ICD-10-CM

## 2016-11-06 DIAGNOSIS — E118 Type 2 diabetes mellitus with unspecified complications: Principal | ICD-10-CM

## 2016-11-06 DIAGNOSIS — I10 Essential (primary) hypertension: Secondary | ICD-10-CM

## 2016-12-09 ENCOUNTER — Ambulatory Visit: Payer: Self-pay | Admitting: Physician Assistant

## 2017-01-09 ENCOUNTER — Ambulatory Visit: Payer: PRIVATE HEALTH INSURANCE | Admitting: Nutrition

## 2017-01-22 ENCOUNTER — Other Ambulatory Visit (HOSPITAL_COMMUNITY)
Admission: RE | Admit: 2017-01-22 | Discharge: 2017-01-22 | Disposition: A | Discharge status: Home / Self Care | Payer: PRIVATE HEALTH INSURANCE | Source: Ambulatory Visit | Attending: Physician Assistant | Admitting: Physician Assistant

## 2017-01-22 DIAGNOSIS — IMO0002 Reserved for concepts with insufficient information to code with codable children: Secondary | ICD-10-CM

## 2017-01-22 DIAGNOSIS — E118 Type 2 diabetes mellitus with unspecified complications: Secondary | ICD-10-CM | POA: Insufficient documentation

## 2017-01-22 DIAGNOSIS — E785 Hyperlipidemia, unspecified: Secondary | ICD-10-CM

## 2017-01-22 DIAGNOSIS — I10 Essential (primary) hypertension: Secondary | ICD-10-CM

## 2017-01-22 DIAGNOSIS — E1165 Type 2 diabetes mellitus with hyperglycemia: Secondary | ICD-10-CM

## 2017-01-22 LAB — COMPREHENSIVE METABOLIC PANEL
ALK PHOS: 96 U/L (ref 38–126)
ALT: 15 U/L (ref 14–54)
AST: 14 U/L — ABNORMAL LOW (ref 15–41)
Albumin: 3.7 g/dL (ref 3.5–5.0)
Anion gap: 7 (ref 5–15)
BILIRUBIN TOTAL: 0.9 mg/dL (ref 0.3–1.2)
BUN: 8 mg/dL (ref 6–20)
CALCIUM: 8.9 mg/dL (ref 8.9–10.3)
CHLORIDE: 103 mmol/L (ref 101–111)
CO2: 24 mmol/L (ref 22–32)
CREATININE: 0.47 mg/dL (ref 0.44–1.00)
Glucose, Bld: 256 mg/dL — ABNORMAL HIGH (ref 65–99)
Potassium: 3.5 mmol/L (ref 3.5–5.1)
Sodium: 134 mmol/L — ABNORMAL LOW (ref 135–145)
TOTAL PROTEIN: 7.5 g/dL (ref 6.5–8.1)

## 2017-01-22 LAB — LIPID PANEL
CHOLESTEROL: 194 mg/dL (ref 0–200)
HDL: 40 mg/dL — AB (ref >40)
LDL CALC: UNDETERMINED mg/dL (ref 0–99)
TRIGLYCERIDES: 517 mg/dL — AB (ref <150)
Total CHOL/HDL Ratio: 4.9 RATIO
VLDL: UNDETERMINED mg/dL (ref 0–40)

## 2017-01-23 LAB — HEMOGLOBIN A1C
Hgb A1c MFr Bld: 8.4 % — ABNORMAL HIGH (ref 4.8–5.6)
MEAN PLASMA GLUCOSE: 194 mg/dL

## 2017-02-11 ENCOUNTER — Encounter: Payer: Self-pay | Admitting: Physician Assistant

## 2017-02-11 ENCOUNTER — Other Ambulatory Visit: Payer: Self-pay | Admitting: Physician Assistant

## 2017-02-11 ENCOUNTER — Ambulatory Visit: Payer: Self-pay | Admitting: Physician Assistant

## 2017-02-11 VITALS — BP 120/68 | HR 86 | Temp 97.7°F | Ht 62.5 in | Wt 246.2 lb

## 2017-02-11 DIAGNOSIS — I10 Essential (primary) hypertension: Secondary | ICD-10-CM

## 2017-02-11 DIAGNOSIS — IMO0002 Reserved for concepts with insufficient information to code with codable children: Secondary | ICD-10-CM

## 2017-02-11 DIAGNOSIS — E118 Type 2 diabetes mellitus with unspecified complications: Principal | ICD-10-CM

## 2017-02-11 DIAGNOSIS — Z1211 Encounter for screening for malignant neoplasm of colon: Secondary | ICD-10-CM

## 2017-02-11 DIAGNOSIS — E785 Hyperlipidemia, unspecified: Secondary | ICD-10-CM

## 2017-02-11 DIAGNOSIS — E1165 Type 2 diabetes mellitus with hyperglycemia: Secondary | ICD-10-CM

## 2017-02-11 DIAGNOSIS — Z1239 Encounter for other screening for malignant neoplasm of breast: Secondary | ICD-10-CM

## 2017-02-11 NOTE — Progress Notes (Signed)
BP 120/68   Pulse 86   Temp 97.7 F (36.5 C)   Ht 5' 2.5" (1.588 m)   Wt 246 lb 4 oz (111.7 kg)   SpO2 98%   BMI 44.32 kg/m    Subjective:    Patient ID: Angelica Bentley, female    DOB: 09/20/1970, 46 y.o.   MRN: [id number]  HPI: Angelica Bentley is a 46 y.o. female presenting on 02/11/2017 for Follow-up   HPI Pt is doing well.  No complaints  Relevant past medical, surgical, family and social history reviewed and updated as indicated. Interim medical history since our last visit reviewed. Allergies and medications reviewed and updated.   Current Outpatient Medications:  .  aspirin 81 MG tablet, Take 81 mg by mouth daily., Disp: , Rfl:  .  fenofibrate micronized (LOFIBRA) 134 MG capsule, 1 po qd.  Tome una tableta por boca diaria, Disp: 30 capsule, Rfl: 4 .  insulin NPH-regular Human (NOVOLIN 70/30) (70-30) 100 UNIT/ML injection, 42 units sq bid before breakfast and supper.   inyecte 42 unidades subcutaneos dos veces diarios (Patient taking differently: Inject 45 Units 2 (two) times daily with a meal into the skin. 45 units sq bid before breakfast and supper.   inyecte 45 unidades subcutaneos dos veces diarios), Disp: 1 vial, Rfl: 3 .  lisinopril (PRINIVIL,ZESTRIL) 5 MG tablet, Take 1 tablet (5 mg total) by mouth daily. Tome una tableta por boca diaria, Disp: 30 tablet, Rfl: 3 .  metFORMIN (GLUCOPHAGE) 1000 MG tablet, 1 po bid for diabetes. Tome una tableta por boca dos veces diarias con comida, Disp: 60 tablet, Rfl: 4 .  Omega-3 Fatty Acids (FISH OIL PO), Take by mouth. 4 cap daily, Disp: , Rfl:  .  simvastatin (ZOCOR) 20 MG tablet, 1 po qhs for cholesterol.  Tome una tableta por boca al dormir, Disp: 30 tablet, Rfl: 4 .  triamcinolone ointment (KENALOG) 0.1 %, Apply to affected area bid prn.  aplique a la area Motorolaafectada dos veces al dia cuando sea necesario (Patient not taking: Reported on 02/11/2017), Disp: 15 g, Rfl: 0   Review of Systems  Constitutional: Negative for  appetite change, chills, diaphoresis, fatigue, fever and unexpected weight change.  HENT: Negative for congestion, dental problem, drooling, ear pain, facial swelling, hearing loss, mouth sores, sneezing, sore throat, trouble swallowing and voice change.   Eyes: Negative for pain, discharge, redness, itching and visual disturbance.  Respiratory: Negative for cough, choking, shortness of breath and wheezing.   Cardiovascular: Negative for chest pain, palpitations and leg swelling.  Gastrointestinal: Negative for abdominal pain, blood in stool, constipation, diarrhea and vomiting.  Endocrine: Negative for cold intolerance, heat intolerance and polydipsia.  Genitourinary: Negative for decreased urine volume, dysuria and hematuria.  Musculoskeletal: Negative for arthralgias, back pain and gait problem.  Skin: Negative for rash.  Allergic/Immunologic: Negative for environmental allergies.  Neurological: Negative for seizures, syncope, light-headedness and headaches.  Hematological: Negative for adenopathy.  Psychiatric/Behavioral: Negative for agitation, dysphoric mood and suicidal ideas. The patient is not nervous/anxious.     Per HPI unless specifically indicated above     Objective:    BP 120/68   Pulse 86   Temp 97.7 F (36.5 C)   Ht 5' 2.5" (1.588 m)   Wt 246 lb 4 oz (111.7 kg)   SpO2 98%   BMI 44.32 kg/m   Wt Readings from Last 3 Encounters:  02/11/17 246 lb 4 oz (111.7 kg)  11/05/16 243 lb 8 oz (110.5 kg)  08/20/16 248 lb 8 oz (112.7 kg)    Physical Exam  Constitutional: She is oriented to person, place, and time. She appears well-developed and well-nourished.  HENT:  Head: Normocephalic and atraumatic.  Neck: Neck supple.  Cardiovascular: Normal rate and regular rhythm.  Pulmonary/Chest: Effort normal and breath sounds normal.  Abdominal: Soft. Bowel sounds are normal. She exhibits no mass. There is no hepatosplenomegaly. There is no tenderness. A hernia is present.  Hernia confirmed positive in the ventral area.  Musculoskeletal: She exhibits no edema.  Lymphadenopathy:    She has no cervical adenopathy.  Neurological: She is alert and oriented to person, place, and time.  Skin: Skin is warm and dry.  Psychiatric: She has a normal mood and affect. Her behavior is normal.  Vitals reviewed.   abd hernia/umbilical  Results for orders placed or performed during the hospital encounter of 01/22/17  HgB A1c  Result Value Ref Range   Hgb A1c MFr Bld 8.4 (H) 4.8 - 5.6 %   Mean Plasma Glucose 194 mg/dL  Lipid Profile  Result Value Ref Range   Cholesterol 194 0 - 200 mg/dL   Triglycerides 161517 (H) <150 mg/dL   HDL 40 (L) >09>40 mg/dL   Total CHOL/HDL Ratio 4.9 RATIO   VLDL UNABLE TO CALCULATE IF TRIGLYCERIDE OVER 400 mg/dL 0 - 40 mg/dL   LDL Cholesterol UNABLE TO CALCULATE IF TRIGLYCERIDE OVER 400 mg/dL 0 - 99 mg/dL  Comprehensive Metabolic Panel (CMET)  Result Value Ref Range   Sodium 134 (L) 135 - 145 mmol/L   Potassium 3.5 3.5 - 5.1 mmol/L   Chloride 103 101 - 111 mmol/L   CO2 24 22 - 32 mmol/L   Glucose, Bld 256 (H) 65 - 99 mg/dL   BUN 8 6 - 20 mg/dL   Creatinine, Ser 6.040.47 0.44 - 1.00 mg/dL   Calcium 8.9 8.9 - 54.010.3 mg/dL   Total Protein 7.5 6.5 - 8.1 g/dL   Albumin 3.7 3.5 - 5.0 g/dL   AST 14 (L) 15 - 41 U/L   ALT 15 14 - 54 U/L   Alkaline Phosphatase 96 38 - 126 U/L   Total Bilirubin 0.9 0.3 - 1.2 mg/dL   GFR calc non Af Amer >60 >60 mL/min   GFR calc Af Amer >60 >60 mL/min   Anion gap 7 5 - 15      Assessment & Plan:   Encounter Diagnoses  Name Primary?  Marland Kitchen. Uncontrolled type 2 diabetes mellitus with complication (HCC) Yes  . Hyperlipidemia, unspecified hyperlipidemia type   . Essential hypertension, benign   . Morbid obesity, unspecified obesity type (HCC)   . Screening for breast cancer   . Screening for colon cancer     -reviewed labs with pt -Pt is on list for DM eye exam -due to continuing triglycerides over 500 despite fish  oil, fenofibrate and simvastatin, will Refer to lipid clinic -pt was given [facility] discount application -pt to increase insulin to 50u bid. Continue metformin, diabetic diet and regular exercise -ordered screening mammogram -Gave iFOBT for colon cancer screening -pt to follow up in 3 months.  RTO sooner prn

## 2017-03-21 ENCOUNTER — Other Ambulatory Visit: Payer: Self-pay | Admitting: Physician Assistant

## 2017-03-21 DIAGNOSIS — Z1231 Encounter for screening mammogram for malignant neoplasm of breast: Secondary | ICD-10-CM

## 2017-04-04 ENCOUNTER — Ambulatory Visit: Payer: PRIVATE HEALTH INSURANCE | Admitting: Internal Medicine

## 2017-05-09 ENCOUNTER — Other Ambulatory Visit (HOSPITAL_COMMUNITY)
Admission: RE | Admit: 2017-05-09 | Discharge: 2017-05-09 | Disposition: A | Discharge status: Home / Self Care | Payer: PRIVATE HEALTH INSURANCE | Source: Ambulatory Visit | Attending: Physician Assistant | Admitting: Physician Assistant

## 2017-05-09 DIAGNOSIS — IMO0002 Reserved for concepts with insufficient information to code with codable children: Secondary | ICD-10-CM

## 2017-05-09 DIAGNOSIS — E118 Type 2 diabetes mellitus with unspecified complications: Secondary | ICD-10-CM | POA: Insufficient documentation

## 2017-05-09 DIAGNOSIS — E1165 Type 2 diabetes mellitus with hyperglycemia: Secondary | ICD-10-CM

## 2017-05-09 LAB — HEMOGLOBIN A1C
Hgb A1c MFr Bld: 8.8 % — ABNORMAL HIGH (ref 4.8–5.6)
MEAN PLASMA GLUCOSE: 205.86 mg/dL

## 2017-05-14 ENCOUNTER — Ambulatory Visit: Payer: Self-pay | Admitting: Physician Assistant

## 2017-05-14 ENCOUNTER — Encounter: Payer: Self-pay | Admitting: Physician Assistant

## 2017-05-14 VITALS — BP 126/68 | HR 83 | Temp 97.3°F | Wt 243.5 lb

## 2017-05-14 DIAGNOSIS — IMO0002 Reserved for concepts with insufficient information to code with codable children: Secondary | ICD-10-CM

## 2017-05-14 DIAGNOSIS — E1165 Type 2 diabetes mellitus with hyperglycemia: Secondary | ICD-10-CM

## 2017-05-14 DIAGNOSIS — E785 Hyperlipidemia, unspecified: Secondary | ICD-10-CM

## 2017-05-14 DIAGNOSIS — I10 Essential (primary) hypertension: Secondary | ICD-10-CM

## 2017-05-14 DIAGNOSIS — M25551 Pain in right hip: Secondary | ICD-10-CM

## 2017-05-14 DIAGNOSIS — R2241 Localized swelling, mass and lump, right lower limb: Secondary | ICD-10-CM

## 2017-05-14 DIAGNOSIS — Z1239 Encounter for other screening for malignant neoplasm of breast: Secondary | ICD-10-CM

## 2017-05-14 DIAGNOSIS — E118 Type 2 diabetes mellitus with unspecified complications: Principal | ICD-10-CM

## 2017-05-14 NOTE — Progress Notes (Signed)
BP 126/68   Pulse 83   Temp (!) 97.3 F (36.3 C)   Wt 243 lb 8 oz (110.5 kg)   SpO2 98%   BMI 43.83 kg/m    Subjective:    Patient ID: Angelica Bentley, female    DOB: 07/15/1970, 47 y.o.   MRN: [id number]  HPI: Angelica Bentley is a 47 y.o. female presenting on 05/14/2017 for Diabetes; Hypertension; and Hyperlipidemia   HPI   Pt insulin was increased to 50u bid at last OV but she is saying that she is using 75 u bid now for some reason.   She did not bring her bs log today.   Pt was referred to lipid clinic at last OV and referral note says VM was left for pt.  Pt says she never got message (likely problem with language since pt only speaks spanish).    Pt complains of pain R hip that began about 2 weeks ago and she feels a lump that causes her to have trouble walking.  APAP helps a little bit.  Pt denies injury.   Pt says she never got call to schedule mammogram that was ordered at last OV  Relevant past medical, surgical, family and social history reviewed and updated as indicated. Interim medical history since our last visit reviewed. Allergies and medications reviewed and updated.   Current Outpatient Medications:  .  aspirin 81 MG tablet, Take 81 mg by mouth daily., Disp: , Rfl:  .  fenofibrate micronized (LOFIBRA) 134 MG capsule, 1 po qd.  Tome una tableta por boca diaria, Disp: 30 capsule, Rfl: 4 .  insulin NPH-regular Human (NOVOLIN 70/30) (70-30) 100 UNIT/ML injection, 42 units sq bid before breakfast and supper.   inyecte 42 unidades subcutaneos dos veces diarios (Patient taking differently: Inject 75 Units into the skin 2 (two) times daily with a meal. 45 units sq bid before breakfast and supper.   inyecte 45 unidades subcutaneos dos veces diarios), Disp: 1 vial, Rfl: 3 .  lisinopril (PRINIVIL,ZESTRIL) 5 MG tablet, Take 1 tablet (5 mg total) by mouth daily. Tome una tableta por boca diaria, Disp: 30 tablet, Rfl: 3 .  metFORMIN (GLUCOPHAGE) 1000 MG tablet, 1 po  bid for diabetes. Tome una tableta por boca dos veces diarias con comida, Disp: 60 tablet, Rfl: 4 .  Omega-3 Fatty Acids (FISH OIL PO), Take by mouth. 4 cap daily, Disp: , Rfl:  .  simvastatin (ZOCOR) 20 MG tablet, 1 po qhs for cholesterol.  Tome una tableta por boca al dormir, Disp: 30 tablet, Rfl: 4  Review of Systems  Constitutional: Negative for appetite change, chills, diaphoresis, fatigue, fever and unexpected weight change.  HENT: Positive for dental problem. Negative for congestion, drooling, ear pain, facial swelling, hearing loss, mouth sores, sneezing, sore throat, trouble swallowing and voice change.   Eyes: Negative for pain, discharge, redness, itching and visual disturbance.  Respiratory: Negative for cough, choking, shortness of breath and wheezing.   Cardiovascular: Negative for chest pain, palpitations and leg swelling.  Gastrointestinal: Negative for abdominal pain, blood in stool, constipation, diarrhea and vomiting.  Endocrine: Negative for cold intolerance, heat intolerance and polydipsia.  Genitourinary: Negative for decreased urine volume, dysuria and hematuria.  Musculoskeletal: Negative for arthralgias, back pain and gait problem.  Skin: Negative for rash.  Allergic/Immunologic: Negative for environmental allergies.  Neurological: Negative for seizures, syncope, light-headedness and headaches.  Hematological: Negative for adenopathy.  Psychiatric/Behavioral: Negative for agitation, dysphoric mood and suicidal ideas. The patient is not  nervous/anxious.     Per HPI unless specifically indicated above     Objective:    BP 126/68   Pulse 83   Temp (!) 97.3 F (36.3 C)   Wt 243 lb 8 oz (110.5 kg)   SpO2 98%   BMI 43.83 kg/m   Wt Readings from Last 3 Encounters:  05/14/17 243 lb 8 oz (110.5 kg)  02/11/17 246 lb 4 oz (111.7 kg)  11/05/16 243 lb 8 oz (110.5 kg)    Physical Exam  Constitutional: She is oriented to person, place, and time. She appears  well-developed and well-nourished.  HENT:  Head: Normocephalic and atraumatic.  Neck: Neck supple.  Cardiovascular: Normal rate and regular rhythm.  Pulmonary/Chest: Effort normal and breath sounds normal.  Abdominal: Soft. Bowel sounds are normal. She exhibits no mass. There is no hepatosplenomegaly. There is no tenderness.  Musculoskeletal: She exhibits no edema.       Right hip: She exhibits tenderness. She exhibits normal range of motion, normal strength, no bony tenderness, no swelling and no crepitus.  Lump palpable L lateral hip area, freely mobile, soft, mildly tender approx 1.5 cm.    Lymphadenopathy:    She has no cervical adenopathy.  Neurological: She is alert and oriented to person, place, and time.  Skin: Skin is warm and dry.  Psychiatric: She has a normal mood and affect. Her behavior is normal.  Vitals reviewed.   Results for orders placed or performed during the hospital encounter of 05/09/17  HgB A1c  Result Value Ref Range   Hgb A1c MFr Bld 8.8 (H) 4.8 - 5.6 %   Mean Plasma Glucose 205.86 mg/dL      Assessment & Plan:    Encounter Diagnoses  Name Primary?  Marland Kitchen Uncontrolled type 2 diabetes mellitus with complication (HCC) Yes  . Hyperlipidemia, unspecified hyperlipidemia type   . Essential hypertension, benign   . Morbid obesity, unspecified obesity type (HCC)   . Screening for breast cancer   . Mass of right hip region   . Pain of right hip joint      -reviewed labs with pt- a1c is increased -no changes to meds today since pt self-increased her insulin to 75 units -pt to monitor fbs and bring log to OV in 1 month.  She is to call office for fbs > 300 or < 70 -pt was given a new [facility] application -will order [place] evaluate lump R hip although suspect lipoma -will reorder mammogram -will have nurse contact lipid clinic for the patient to get her appointment -follow up 1 month with bs log.  RTO sooner prn

## 2017-05-19 ENCOUNTER — Other Ambulatory Visit: Payer: Self-pay | Admitting: Physician Assistant

## 2017-05-19 DIAGNOSIS — R2241 Localized swelling, mass and lump, right lower limb: Secondary | ICD-10-CM

## 2017-05-23 ENCOUNTER — Ambulatory Visit (HOSPITAL_COMMUNITY)
Admission: RE | Admit: 2017-05-23 | Discharge: 2017-05-23 | Disposition: A | Discharge status: Home / Self Care | Payer: Self-pay | Source: Ambulatory Visit | Attending: Physician Assistant | Admitting: Physician Assistant

## 2017-05-23 DIAGNOSIS — R2241 Localized swelling, mass and lump, right lower limb: Secondary | ICD-10-CM | POA: Insufficient documentation

## 2017-06-11 ENCOUNTER — Encounter: Payer: Self-pay | Admitting: Physician Assistant

## 2017-06-11 ENCOUNTER — Ambulatory Visit: Payer: PRIVATE HEALTH INSURANCE | Admitting: Physician Assistant

## 2017-06-11 VITALS — BP 119/70 | HR 95 | Temp 98.4°F | Ht 62.5 in | Wt 242.5 lb

## 2017-06-11 DIAGNOSIS — I10 Essential (primary) hypertension: Secondary | ICD-10-CM

## 2017-06-11 DIAGNOSIS — E118 Type 2 diabetes mellitus with unspecified complications: Principal | ICD-10-CM

## 2017-06-11 DIAGNOSIS — IMO0002 Reserved for concepts with insufficient information to code with codable children: Secondary | ICD-10-CM

## 2017-06-11 DIAGNOSIS — E1165 Type 2 diabetes mellitus with hyperglycemia: Secondary | ICD-10-CM

## 2017-06-11 DIAGNOSIS — E785 Hyperlipidemia, unspecified: Secondary | ICD-10-CM

## 2017-06-11 NOTE — Progress Notes (Signed)
BP 119/70 (BP Location: Right Arm, Patient Position: Sitting, Cuff Size: Large)   Pulse 95   Temp 98.4 F (36.9 C) (Oral)   Ht 5' 2.5" (1.588 m)   Wt 242 lb 8 oz (110 kg)   LMP 05/28/2017 (Exact Date)   SpO2 98%   BMI 43.65 kg/m    Subjective:    Patient ID: Angelica Bentley, female    DOB: 03/08/1971, 47 y.o.   MRN: [id number]  HPI: Angelica Bentley is a 47 y.o. female presenting on 06/11/2017 for Follow-up   HPI   Pt has bs log- all 100-115.  She is feeling well.   Relevant past medical, surgical, family and social history reviewed and updated as indicated. Interim medical history since our last visit reviewed. Allergies and medications reviewed and updated.   Current Outpatient Medications:  .  aspirin 81 MG tablet, Take 81 mg by mouth daily., Disp: , Rfl:  .  fenofibrate micronized (LOFIBRA) 134 MG capsule, 1 po qd.  Tome una tableta por boca diaria, Disp: 30 capsule, Rfl: 4 .  insulin NPH-regular Human (NOVOLIN 70/30) (70-30) 100 UNIT/ML injection, 42 units sq bid before breakfast and supper.   inyecte 42 unidades subcutaneos dos veces diarios (Patient taking differently: Inject 75 Units into the skin 2 (two) times daily with a meal. 75 units sq bid before breakfast and supper.   inyecte 75 unidades subcutaneos dos veces diarios), Disp: 1 vial, Rfl: 3 .  lisinopril (PRINIVIL,ZESTRIL) 5 MG tablet, Take 1 tablet (5 mg total) by mouth daily. Tome una tableta por boca diaria, Disp: 30 tablet, Rfl: 3 .  metFORMIN (GLUCOPHAGE) 1000 MG tablet, 1 po bid for diabetes. Tome una tableta por boca dos veces diarias con comida, Disp: 60 tablet, Rfl: 4 .  Omega-3 Fatty Acids (FISH OIL PO), Take by mouth. 4 cap daily, Disp: , Rfl:  .  simvastatin (ZOCOR) 20 MG tablet, 1 po qhs for cholesterol.  Tome una tableta por boca al dormir, Disp: 30 tablet, Rfl: 4   Review of Systems  Constitutional: Negative for appetite change, chills, diaphoresis, fatigue, fever and unexpected weight  change.  HENT: Positive for dental problem. Negative for congestion, drooling, ear pain, facial swelling, hearing loss, mouth sores, sneezing, sore throat, trouble swallowing and voice change.   Eyes: Negative for pain, discharge, redness, itching and visual disturbance.  Respiratory: Negative for cough, choking, shortness of breath and wheezing.   Cardiovascular: Negative for chest pain, palpitations and leg swelling.  Gastrointestinal: Negative for abdominal pain, blood in stool, constipation, diarrhea and vomiting.  Endocrine: Negative for cold intolerance, heat intolerance and polydipsia.  Genitourinary: Negative for decreased urine volume, dysuria and hematuria.  Musculoskeletal: Negative for arthralgias, back pain and gait problem.  Skin: Negative for rash.  Allergic/Immunologic: Negative for environmental allergies.  Neurological: Negative for seizures, syncope, light-headedness and headaches.  Hematological: Negative for adenopathy.  Psychiatric/Behavioral: Negative for agitation, dysphoric mood and suicidal ideas. The patient is not nervous/anxious.     Per HPI unless specifically indicated above     Objective:    BP 119/70 (BP Location: Right Arm, Patient Position: Sitting, Cuff Size: Large)   Pulse 95   Temp 98.4 F (36.9 C) (Oral)   Ht 5' 2.5" (1.588 m)   Wt 242 lb 8 oz (110 kg)   LMP 05/28/2017 (Exact Date)   SpO2 98%   BMI 43.65 kg/m   Wt Readings from Last 3 Encounters:  06/11/17 242 lb 8 oz (110 kg)  05/14/17  243 lb 8 oz (110.5 kg)  02/11/17 246 lb 4 oz (111.7 kg)    Physical Exam  Constitutional: She is oriented to person, place, and time. She appears well-developed and well-nourished.  HENT:  Head: Normocephalic and atraumatic.  Neck: Neck supple.  Cardiovascular: Normal rate and regular rhythm.  Pulmonary/Chest: Effort normal and breath sounds normal.  Abdominal: Soft. Bowel sounds are normal. She exhibits no mass. There is no hepatosplenomegaly. There is  no tenderness.  Musculoskeletal: She exhibits no edema.  Lymphadenopathy:    She has no cervical adenopathy.  Neurological: She is alert and oriented to person, place, and time.  Skin: Skin is warm and dry.  Psychiatric: She has a normal mood and affect. Her behavior is normal.  Vitals reviewed.   Results for orders placed or performed during the hospital encounter of 05/09/17  HgB A1c  Result Value Ref Range   Hgb A1c MFr Bld 8.8 (H) 4.8 - 5.6 %   Mean Plasma Glucose 205.86 mg/dL      Assessment & Plan:   Encounter Diagnoses  Name Primary?  Marland Kitchen Uncontrolled type 2 diabetes mellitus with complication (HCC) Yes  . Hyperlipidemia, unspecified hyperlipidemia type   . Essential hypertension, benign   . Morbid obesity, unspecified obesity type (HCC)     -pt to Continue currrent dm meds.  She is counseled to watch diet  -pt to go to Lipid clinic appt next month as scheduled -pt to follow up here 2 months.  RTO sooner prn

## 2017-06-30 ENCOUNTER — Encounter: Payer: Self-pay | Admitting: Physician Assistant

## 2017-07-28 ENCOUNTER — Ambulatory Visit: Payer: PRIVATE HEALTH INSURANCE | Admitting: Internal Medicine

## 2017-07-29 ENCOUNTER — Encounter: Payer: Self-pay | Admitting: *Deleted

## 2017-08-11 ENCOUNTER — Ambulatory Visit: Payer: PRIVATE HEALTH INSURANCE | Admitting: Physician Assistant

## 2017-08-18 ENCOUNTER — Ambulatory Visit: Payer: PRIVATE HEALTH INSURANCE | Admitting: Physician Assistant

## 2018-10-06 ENCOUNTER — Emergency Department (HOSPITAL_COMMUNITY)
Admission: EM | Admit: 2018-10-06 | Discharge: 2018-10-06 | Disposition: A | Discharge status: Home / Self Care | Payer: HRSA Program | Attending: Emergency Medicine | Admitting: Emergency Medicine

## 2018-10-06 ENCOUNTER — Encounter (HOSPITAL_COMMUNITY): Payer: Self-pay | Admitting: Emergency Medicine

## 2018-10-06 ENCOUNTER — Emergency Department (HOSPITAL_COMMUNITY): Payer: HRSA Program

## 2018-10-06 ENCOUNTER — Other Ambulatory Visit: Payer: Self-pay

## 2018-10-06 DIAGNOSIS — E119 Type 2 diabetes mellitus without complications: Secondary | ICD-10-CM | POA: Insufficient documentation

## 2018-10-06 DIAGNOSIS — U071 COVID-19: Secondary | ICD-10-CM | POA: Diagnosis not present

## 2018-10-06 DIAGNOSIS — R1110 Vomiting, unspecified: Secondary | ICD-10-CM | POA: Diagnosis present

## 2018-10-06 DIAGNOSIS — R112 Nausea with vomiting, unspecified: Secondary | ICD-10-CM | POA: Insufficient documentation

## 2018-10-06 DIAGNOSIS — R197 Diarrhea, unspecified: Secondary | ICD-10-CM | POA: Diagnosis not present

## 2018-10-06 LAB — URINALYSIS, ROUTINE W REFLEX MICROSCOPIC
Bacteria, UA: NONE SEEN
Bilirubin Urine: NEGATIVE
Glucose, UA: >=500 mg/dL — AB
Hgb urine dipstick: NEGATIVE
Ketones, ur: NEGATIVE mg/dL
Leukocytes,Ua: NEGATIVE
Nitrite: NEGATIVE
Protein, ur: 30 mg/dL — AB
Specific Gravity, Urine: 1.043 — ABNORMAL HIGH (ref 1.005–1.030)
pH: 6 (ref 5.0–8.0)

## 2018-10-06 LAB — COMPREHENSIVE METABOLIC PANEL
ALT: 23 U/L (ref 0–44)
AST: 21 U/L (ref 15–41)
Albumin: 3.5 g/dL (ref 3.5–5.0)
Alkaline Phosphatase: 117 U/L (ref 38–126)
Anion gap: 12 (ref 5–15)
BUN: 10 mg/dL (ref 6–20)
CO2: 22 mmol/L (ref 22–32)
Calcium: 8.8 mg/dL — ABNORMAL LOW (ref 8.9–10.3)
Chloride: 100 mmol/L (ref 98–111)
Creatinine, Ser: 0.71 mg/dL (ref 0.44–1.00)
GFR calc Af Amer: >60 mL/min (ref >60)
GFR calc non Af Amer: >60 mL/min (ref >60)
Glucose, Bld: 326 mg/dL — ABNORMAL HIGH (ref 70–99)
Potassium: 3.3 mmol/L — ABNORMAL LOW (ref 3.5–5.1)
Sodium: 134 mmol/L — ABNORMAL LOW (ref 135–145)
Total Bilirubin: 0.8 mg/dL (ref 0.3–1.2)
Total Protein: 8.3 g/dL — ABNORMAL HIGH (ref 6.5–8.1)

## 2018-10-06 LAB — CBC
HCT: 32.2 % — ABNORMAL LOW (ref 36.0–46.0)
Hemoglobin: 10.9 g/dL — ABNORMAL LOW (ref 12.0–15.0)
MCH: 28.2 pg (ref 26.0–34.0)
MCHC: 33.9 g/dL (ref 30.0–36.0)
MCV: 83.2 fL (ref 80.0–100.0)
Platelets: 191 10*3/uL (ref 150–400)
RBC: 3.87 MIL/uL (ref 3.87–5.11)
RDW: 14.8 % (ref 11.5–15.5)
WBC: 3.5 10*3/uL — ABNORMAL LOW (ref 4.0–10.5)
nRBC: 0 % (ref 0.0–0.2)

## 2018-10-06 LAB — CBG MONITORING, ED: Glucose-Capillary: 286 mg/dL — ABNORMAL HIGH (ref 70–99)

## 2018-10-06 LAB — PREGNANCY, URINE: Preg Test, Ur: NEGATIVE

## 2018-10-06 LAB — LIPASE, BLOOD: Lipase: 25 U/L (ref 11–51)

## 2018-10-06 LAB — SARS CORONAVIRUS 2 BY RT PCR (HOSPITAL ORDER, PERFORMED IN ~~LOC~~ HOSPITAL LAB): SARS Coronavirus 2: POSITIVE — AB

## 2018-10-06 MED ORDER — SODIUM CHLORIDE 0.9 % IV BOLUS
1000.0000 mL | Freq: Once | INTRAVENOUS | Status: AC
  Administered 2018-10-06: 16:00:00 1000 mL via INTRAVENOUS

## 2018-10-06 MED ORDER — IOHEXOL 300 MG/ML  SOLN
100.0000 mL | Freq: Once | INTRAMUSCULAR | Status: AC | PRN
  Administered 2018-10-06: 100 mL via INTRAVENOUS

## 2018-10-06 MED ORDER — FENTANYL CITRATE (PF) 100 MCG/2ML IJ SOLN
25.0000 ug | Freq: Once | INTRAMUSCULAR | Status: AC
  Administered 2018-10-06: 16:00:00 25 ug via INTRAVENOUS
  Filled 2018-10-06: qty 2

## 2018-10-06 MED ORDER — ONDANSETRON HCL 4 MG/2ML IJ SOLN
4.0000 mg | Freq: Once | INTRAMUSCULAR | Status: AC
  Administered 2018-10-06: 16:00:00 4 mg via INTRAVENOUS
  Filled 2018-10-06: qty 2

## 2018-10-06 NOTE — Discharge Instructions (Addendum)
You were evaluated in the Emergency Department and after careful evaluation, we did not find any emergent condition requiring admission or further testing in the hospital.  Your symptoms today seem to be due to the coronavirus infection.  As discussed, we recommend that you remain home in isolation for the next 1 to 2 weeks to prevent the spread of this virus.  Please return to the Emergency Department if you experience any worsening of your condition.  We encourage you to follow up with a primary care provider.  Thank you for allowing [place] to be a part of your care.

## 2018-10-06 NOTE — ED Notes (Signed)
Date and time results received: 10/06/18 8:56 PM  (use smartphrase ".now" to insert current time)  Test: covid Critical Value: pos   Name of Provider Notified:  bero  rders Received? Or Actions Taken?:

## 2018-10-06 NOTE — ED Provider Notes (Signed)
[facility] Emergency Department Provider Note MRN:  [id number]  Arrival date & time: 10/06/18     Chief Complaint   Emesis   History of Present Illness   Angelica Bentley is a 48 y.o. year-old female with a history of diabetes, H. pylori presenting to the ED with chief complaint of abdominal pain.  The pain is located in the epigastrium as well as the lower abdominal quadrants.  Has been present for 5 days, constant, progressively worsening.  Associated with nausea, vomiting, diarrhea, multiple episodes, nonbloody.  Denies fever, no chest pain or shortness of breath.  Pain is constant, moderate in severity, no exacerbating or alleviating factors.  Has had gallbladder surgery in the past.  Review of Systems  A complete 10 system review of systems was obtained and all systems are negative except as noted in the HPI and PMH.   Patient's Health History    Past Medical History:  Diagnosis Date  . Diabetes mellitus without complication (HCC) 2014  . H. pylori infection 2014  . Hyperlipidemia     Past Surgical History:  Procedure Laterality Date  . CHOLECYSTECTOMY    . TUBAL LIGATION      No family history on file.  Social History   Socioeconomic History  . Marital status: Single    Spouse name: Not on file  . Number of children: Not on file  . Years of education: Not on file  . Highest education level: Not on file  Occupational History  . Not on file  Social Needs  . Financial resource strain: Not on file  . Food insecurity    Worry: Not on file    Inability: Not on file  . Transportation needs    Medical: Not on file    Non-medical: Not on file  Tobacco Use  . Smoking status: Never Smoker  . Smokeless tobacco: Never Used  Substance and Sexual Activity  . Alcohol use: No  . Drug use: No  . Sexual activity: Not on file  Lifestyle  . Physical activity    Days per week: Not on file    Minutes per session: Not on file  . Stress: Not on file   Relationships  . Social Musicianconnections    Talks on phone: Not on file    Gets together: Not on file    Attends religious service: Not on file    Active member of club or organization: Not on file    Attends meetings of clubs or organizations: Not on file    Relationship status: Not on file  . Intimate partner violence    Fear of current or ex partner: Not on file    Emotionally abused: Not on file    Physically abused: Not on file    Forced sexual activity: Not on file  Other Topics Concern  . Not on file  Social History Narrative  . Not on file     Physical Exam  Vital Signs and Nursing Notes reviewed Vitals:   10/06/18 1700 10/06/18 1818  BP: 113/64 103/60  Pulse: 66 75  Resp: 20 (!) 22  Temp:    SpO2: 96% 100%    CONSTITUTIONAL: Well-appearing, NAD NEURO:  Alert and oriented x 3, no focal deficits EYES:  eyes equal and reactive ENT/NECK:  no LAD, no JVD CARDIO: Regular rate, well-perfused, normal S1 and S2 PULM:  CTAB no wheezing or rhonchi GI/GU:  normal bowel sounds, non-distended, non-tender MSK/SPINE:  No gross deformities, no edema  SKIN:  no rash, atraumatic PSYCH:  Appropriate speech and behavior  Diagnostic and Interventional Summary    Labs Reviewed  COMPREHENSIVE METABOLIC PANEL - Abnormal; Notable for the following components:      Result Value   Sodium 134 (*)    Potassium 3.3 (*)    Glucose, Bld 326 (*)    Calcium 8.8 (*)    Total Protein 8.3 (*)    All other components within normal limits  CBC - Abnormal; Notable for the following components:   WBC 3.5 (*)    Hemoglobin 10.9 (*)    HCT 32.2 (*)    All other components within normal limits  URINALYSIS, ROUTINE W REFLEX MICROSCOPIC - Abnormal; Notable for the following components:   APPearance HAZY (*)    Specific Gravity, Urine 1.043 (*)    Glucose, UA >=500 (*)    Protein, ur 30 (*)    All other components within normal limits  CBG MONITORING, ED - Abnormal; Notable for the following  components:   Glucose-Capillary 286 (*)    All other components within normal limits  SARS CORONAVIRUS 2 (HOSPITAL ORDER, PERFORMED IN [facility])  LIPASE, BLOOD  PREGNANCY, URINE    CT ABDOMEN PELVIS W CONTRAST  Final Result      Medications  sodium chloride 0.9 % bolus 1,000 mL (0 mLs Intravenous Stopped 10/06/18 1750)  ondansetron (ZOFRAN) injection 4 mg (4 mg Intravenous Given 10/06/18 1553)  fentaNYL (SUBLIMAZE) injection 25 mcg (25 mcg Intravenous Given 10/06/18 1553)  iohexol (OMNIPAQUE) 300 MG/ML solution 100 mL (100 mLs Intravenous Contrast Given 10/06/18 1751)     Procedures Critical Care  ED Course and Medical Decision Making  I have reviewed the triage vital signs and the nursing notes.  Pertinent labs & imaging results that were available during my care of the patient were reviewed by me and considered in my medical decision making (see below for details).  Lower abdominal pain this 48 year old female history of diabetes and obesity, CT to exclude emergent surgical process.  CT reveals no emergent abdominal process, slightly enlarged appendix but she is not focally tender here and there were no secondary signs of infection.  Patient's lung do show signs of viral infection, possibly coronavirus.  Patient symptoms could also be explained by coronavirus, though a less common presentation.  These findings discussed with patient, who does want testing today.  Will likely advise home isolation regardless of the test result given its questionable sensitivity.  After the discussed management above, the patient was determined to be safe for discharge.  The patient was in agreement with this plan and all questions regarding their care were answered.  ED return precautions were discussed and the patient will return to the ED with any significant worsening of condition.  Angelica Bentley was evaluated in Emergency Department on 10/06/2018 for the symptoms described in the  history of present illness. She was evaluated in the context of the global COVID-19 pandemic, which necessitated consideration that the patient might be at risk for infection with the SARS-CoV-2 virus that causes COVID-19. Institutional protocols and algorithms that pertain to the evaluation of patients at risk for COVID-19 are in a state of rapid change based on information released by regulatory bodies including the CDC and federal and state organizations. These policies and algorithms were followed during the patient's care in the ED.  Elmer SowMichael M. Pilar PlateBero, MD [facility] [email or web address]  Final Clinical Impressions(s) / ED  Diagnoses     ICD-10-CM   1. Nausea vomiting and diarrhea  R11.2    R19.7     ED Discharge Orders    None         Maudie Flakes, MD 10/06/18 2004

## 2018-10-06 NOTE — ED Triage Notes (Signed)
N/V/D for past 2 days

## 2018-10-06 NOTE — ED Notes (Signed)
Used translator to speak with pt, she still does not need to use the restroom at this time.

## 2018-10-06 NOTE — ED Notes (Signed)
Pt c/o 4 days of mid abd and mid chest pain with n/v/d. States has had n/v x 2 and diarrhea x 3 today, denies any blood. Nad. abd soft

## 2020-07-25 NOTE — Congregational Nurse Program (Signed)
Pt arrived at [facility] to receive assistance in establishing care with a primary care provider for a wellness check and a need for dental referral   She states she currently is having some dental pain.   A current and past medical hx interview was completed.  States hx of diabetes but denies taking medications due to being diet controlled only as prescribed in the past by medical provider.  Plan Referred to Care Connect Program for enrollment and eligibility that was completed on today's visit by D.Leavy Cella  -Referral also given to RN Nurse Case Manager, Norval Gable, for initial and continous medical case management upon completion of first medical appointment with the [facility] [place].      Appointment was scheduled for Aug 06, 2020 @ 1:15 pm at the [facility]

## 2020-08-03 ENCOUNTER — Other Ambulatory Visit: Payer: Self-pay

## 2020-08-03 ENCOUNTER — Ambulatory Visit: Payer: PRIVATE HEALTH INSURANCE | Admitting: Physician Assistant

## 2020-08-03 ENCOUNTER — Encounter: Payer: Self-pay | Admitting: Physician Assistant

## 2020-08-03 VITALS — BP 125/74 | HR 88 | Temp 98.0°F | Ht 63.0 in | Wt 213.0 lb

## 2020-08-03 DIAGNOSIS — IMO0002 Reserved for concepts with insufficient information to code with codable children: Secondary | ICD-10-CM

## 2020-08-03 DIAGNOSIS — E669 Obesity, unspecified: Secondary | ICD-10-CM

## 2020-08-03 DIAGNOSIS — D649 Anemia, unspecified: Secondary | ICD-10-CM

## 2020-08-03 DIAGNOSIS — E1165 Type 2 diabetes mellitus with hyperglycemia: Secondary | ICD-10-CM

## 2020-08-03 DIAGNOSIS — Z1211 Encounter for screening for malignant neoplasm of colon: Secondary | ICD-10-CM

## 2020-08-03 DIAGNOSIS — Z7689 Persons encountering health services in other specified circumstances: Secondary | ICD-10-CM

## 2020-08-03 DIAGNOSIS — Z1239 Encounter for other screening for malignant neoplasm of breast: Secondary | ICD-10-CM

## 2020-08-03 DIAGNOSIS — E785 Hyperlipidemia, unspecified: Secondary | ICD-10-CM

## 2020-08-03 DIAGNOSIS — K0889 Other specified disorders of teeth and supporting structures: Secondary | ICD-10-CM

## 2020-08-03 NOTE — Progress Notes (Signed)
BP 125/74   Pulse 88   Temp 98 F (36.7 C)   Ht 5\' 3"  (1.6 m)   Wt 213 lb (96.6 kg)   SpO2 96%   BMI 37.73 kg/m    Subjective:    Patient ID: , female    DOB: 06-Jan-1971, 50 y.o.   MRN: [id number]  HPI: Angelica Bentley is a 50 y.o. female presenting on 08/03/2020 for No chief complaint on file.   HPI    Pt had negative covid 19 screening questionnaire.   Pt is 49yoF who presents to re-establish care.  Her Last visit here was 06/11/2017. Pt says main reason she wanted to return was to get dental work.   She has History diabetes.  Pt Got covid vaccination.  Also she had infection  July 2020.  She has Not gotten booster  She has had difficulty hearing from L ear since she had covid 2 years ago     Relevant past medical, surgical, family and social history reviewed and updated as indicated. Interim medical history since our last visit reviewed. Allergies and medications reviewed and updated.   CURRENT MEDS: None    Review of Systems  Per HPI unless specifically indicated above     Objective:    BP 125/74   Pulse 88   Temp 98 F (36.7 C)   Ht 5\' 3"  (1.6 m)   Wt 213 lb (96.6 kg)   SpO2 96%   BMI 37.73 kg/m   Wt Readings from Last 3 Encounters:  08/03/20 213 lb (96.6 kg)  10/06/18 215 lb (97.5 kg)  06/11/17 242 lb 8 oz (110 kg)    Physical Exam Vitals reviewed.  Constitutional:      General: She is not in acute distress.    Appearance: She is well-developed. She is obese. She is not toxic-appearing.  HENT:     Head: Normocephalic and atraumatic.     Right Ear: There is impacted cerumen.     Left Ear: There is impacted cerumen.  Eyes:     Conjunctiva/sclera: Conjunctivae normal.     Pupils: Pupils are equal, round, and reactive to light.  Neck:     Thyroid: No thyromegaly.  Cardiovascular:     Rate and Rhythm: Normal rate and regular rhythm.  Pulmonary:     Effort: Pulmonary effort is normal.     Breath sounds: Normal  breath sounds.  Abdominal:     General: Bowel sounds are normal.     Palpations: Abdomen is soft. There is no mass.     Tenderness: There is no abdominal tenderness.     Hernia: A hernia is present. Hernia is present in the umbilical area.  Musculoskeletal:     Cervical back: Neck supple.     Right lower leg: No edema.     Left lower leg: No edema.  Lymphadenopathy:     Cervical: No cervical adenopathy.  Skin:    General: Skin is warm and dry.  Neurological:     Mental Status: She is alert and oriented to person, place, and time.     Gait: Gait normal.  Psychiatric:        Behavior: Behavior normal.      umb hern       Assessment & Plan:    Encounter Diagnoses  Name Primary?  . Encounter to establish care Yes  . Uncontrolled type 2 diabetes mellitus with complication (HCC)   . Hyperlipidemia, unspecified hyperlipidemia type   .  Obesity, unspecified classification, unspecified obesity type, unspecified whether serious comorbidity present   . Encounter for screening for malignant neoplasm of breast, unspecified screening modality   . Dentalgia   . Screening for colon cancer   . Anemia, unspecified type       -will Update labs -refer for screening Mammogram -pt ill be put on Dental list -diabetic foot exam was updated -pt was given FIT test for colon cancer screening -pt to follow up 1 month.   She is to contact office sooner prn

## 2020-08-05 ENCOUNTER — Other Ambulatory Visit: Payer: Self-pay | Admitting: Physician Assistant

## 2020-08-05 DIAGNOSIS — Z1211 Encounter for screening for malignant neoplasm of colon: Secondary | ICD-10-CM

## 2020-08-08 LAB — IFOBT (OCCULT BLOOD): IFOBT: NEGATIVE

## 2020-08-21 ENCOUNTER — Ambulatory Visit (HOSPITAL_COMMUNITY)
Admission: RE | Admit: 2020-08-21 | Discharge: 2020-08-21 | Disposition: A | Discharge status: Home / Self Care | Payer: Self-pay | Source: Ambulatory Visit | Attending: Physician Assistant | Admitting: Physician Assistant

## 2020-08-21 DIAGNOSIS — Z1239 Encounter for other screening for malignant neoplasm of breast: Secondary | ICD-10-CM | POA: Insufficient documentation

## 2020-09-04 ENCOUNTER — Other Ambulatory Visit: Payer: Self-pay

## 2020-09-04 ENCOUNTER — Other Ambulatory Visit (HOSPITAL_COMMUNITY)
Admission: RE | Admit: 2020-09-04 | Discharge: 2020-09-04 | Disposition: A | Discharge status: Home / Self Care | Payer: Self-pay | Source: Ambulatory Visit | Attending: Physician Assistant | Admitting: Physician Assistant

## 2020-09-04 DIAGNOSIS — E118 Type 2 diabetes mellitus with unspecified complications: Secondary | ICD-10-CM | POA: Insufficient documentation

## 2020-09-04 DIAGNOSIS — IMO0002 Reserved for concepts with insufficient information to code with codable children: Secondary | ICD-10-CM

## 2020-09-04 DIAGNOSIS — E1165 Type 2 diabetes mellitus with hyperglycemia: Secondary | ICD-10-CM | POA: Insufficient documentation

## 2020-09-04 DIAGNOSIS — D649 Anemia, unspecified: Secondary | ICD-10-CM | POA: Insufficient documentation

## 2020-09-04 DIAGNOSIS — E785 Hyperlipidemia, unspecified: Secondary | ICD-10-CM | POA: Insufficient documentation

## 2020-09-04 LAB — COMPREHENSIVE METABOLIC PANEL
ALT: 15 U/L (ref 0–44)
AST: 11 U/L — ABNORMAL LOW (ref 15–41)
Albumin: 3.6 g/dL (ref 3.5–5.0)
Alkaline Phosphatase: 97 U/L (ref 38–126)
Anion gap: 6 (ref 5–15)
BUN: 11 mg/dL (ref 6–20)
CO2: 26 mmol/L (ref 22–32)
Calcium: 8.7 mg/dL — ABNORMAL LOW (ref 8.9–10.3)
Chloride: 104 mmol/L (ref 98–111)
Creatinine, Ser: 0.38 mg/dL — ABNORMAL LOW (ref 0.44–1.00)
GFR, Estimated: >60 mL/min (ref >60)
Glucose, Bld: 288 mg/dL — ABNORMAL HIGH (ref 70–99)
Potassium: 3.5 mmol/L (ref 3.5–5.1)
Sodium: 136 mmol/L (ref 135–145)
Total Bilirubin: 1.2 mg/dL (ref 0.3–1.2)
Total Protein: 7.3 g/dL (ref 6.5–8.1)

## 2020-09-04 LAB — LIPID PANEL
Cholesterol: 197 mg/dL (ref 0–200)
HDL: 52 mg/dL (ref >40)
LDL Cholesterol: 110 mg/dL — ABNORMAL HIGH (ref 0–99)
Total CHOL/HDL Ratio: 3.8 RATIO
Triglycerides: 176 mg/dL — ABNORMAL HIGH (ref <150)
VLDL: 35 mg/dL (ref 0–40)

## 2020-09-04 LAB — CBC
HCT: 33.8 % — ABNORMAL LOW (ref 36.0–46.0)
Hemoglobin: 11.5 g/dL — ABNORMAL LOW (ref 12.0–15.0)
MCH: 29.6 pg (ref 26.0–34.0)
MCHC: 34 g/dL (ref 30.0–36.0)
MCV: 87.1 fL (ref 80.0–100.0)
Platelets: 157 10*3/uL (ref 150–400)
RBC: 3.88 MIL/uL (ref 3.87–5.11)
RDW: 15.2 % (ref 11.5–15.5)
WBC: 3.8 10*3/uL — ABNORMAL LOW (ref 4.0–10.5)
nRBC: 0 % (ref 0.0–0.2)

## 2020-09-05 LAB — HEMOGLOBIN A1C
Hgb A1c MFr Bld: 9.8 % — ABNORMAL HIGH (ref 4.8–5.6)
Mean Plasma Glucose: 235 mg/dL

## 2020-09-05 LAB — MICROALBUMIN, URINE: Microalb, Ur: 83.3 ug/mL — ABNORMAL HIGH

## 2020-09-06 ENCOUNTER — Other Ambulatory Visit: Payer: Self-pay

## 2020-09-06 ENCOUNTER — Encounter: Payer: Self-pay | Admitting: Physician Assistant

## 2020-09-06 ENCOUNTER — Ambulatory Visit: Payer: PRIVATE HEALTH INSURANCE | Admitting: Physician Assistant

## 2020-09-06 VITALS — BP 112/64 | HR 75 | Temp 98.1°F | Wt 214.0 lb

## 2020-09-06 DIAGNOSIS — Z789 Other specified health status: Secondary | ICD-10-CM

## 2020-09-06 DIAGNOSIS — I83813 Varicose veins of bilateral lower extremities with pain: Secondary | ICD-10-CM

## 2020-09-06 DIAGNOSIS — IMO0002 Reserved for concepts with insufficient information to code with codable children: Secondary | ICD-10-CM

## 2020-09-06 DIAGNOSIS — E785 Hyperlipidemia, unspecified: Secondary | ICD-10-CM

## 2020-09-06 DIAGNOSIS — E669 Obesity, unspecified: Secondary | ICD-10-CM

## 2020-09-06 MED ORDER — GLIPIZIDE 5 MG PO TABS: 5.0000 mg | ORAL_TABLET | Freq: Two times a day (BID) | ORAL | 3 refills | Status: DC

## 2020-09-06 MED ORDER — ATORVASTATIN CALCIUM 20 MG PO TABS: 20.0000 mg | ORAL_TABLET | Freq: Every day | ORAL | 3 refills | Status: DC

## 2020-09-06 MED ORDER — METFORMIN HCL 1000 MG PO TABS: 1000.0000 mg | ORAL_TABLET | Freq: Two times a day (BID) | ORAL | 3 refills | Status: DC

## 2020-09-06 NOTE — Progress Notes (Signed)
BP 112/64   Pulse 75   Temp 98.1 F (36.7 C)   Wt 214 lb (97.1 kg)   LMP 08/21/2020   SpO2 95%   BMI 37.91 kg/m    Subjective:    Patient ID: Angelica Bentley, female    DOB: [birth year], 50 y.o.   MRN: [id number]  HPI: Angelica Bentley is a 50 y.o. female presenting on 09/06/2020 for No chief complaint on file.   HPI     Pt had a negative covid 19 screening questionnaire.   Pt is 50yoF who presents for follow up to "new pt" appt in May.      Relevant past medical, surgical, family and social history reviewed and updated as indicated. Interim medical history since our last visit reviewed. Allergies and medications reviewed and updated.  Current meds: none  Review of Systems  Per HPI unless specifically indicated above     Objective:    BP 112/64   Pulse 75   Temp 98.1 F (36.7 C)   Wt 214 lb (97.1 kg)   LMP 08/21/2020   SpO2 95%   BMI 37.91 kg/m   Wt Readings from Last 3 Encounters:  09/06/20 214 lb (97.1 kg)  08/03/20 213 lb (96.6 kg)  10/06/18 215 lb (97.5 kg)    Physical Exam Vitals reviewed.  Constitutional:      Appearance: She is well-developed.  HENT:     Head: Normocephalic and atraumatic.  Cardiovascular:     Rate and Rhythm: Normal rate and regular rhythm.  Pulmonary:     Effort: Pulmonary effort is normal.     Breath sounds: Normal breath sounds.  Abdominal:     General: Bowel sounds are normal.     Palpations: Abdomen is soft. There is no mass.     Tenderness: There is no abdominal tenderness.  Musculoskeletal:     Cervical back: Neck supple.     Right lower leg: No edema.     Left lower leg: No edema.     Comments: LE varicosities  Lymphadenopathy:     Cervical: No cervical adenopathy.  Skin:    General: Skin is warm and dry.  Neurological:     Mental Status: She is alert and oriented to person, place, and time.  Psychiatric:        Attention and Perception: Attention normal.        Mood and Affect: Mood normal.         Speech: Speech normal.        Behavior: Behavior normal. Behavior is cooperative.     Results for orders placed or performed during the hospital encounter of 09/04/20  Microalbumin, urine  Result Value Ref Range   Microalb, Ur 83.3 (H) Not Estab. ug/mL  Hemoglobin A1c  Result Value Ref Range   Hgb A1c MFr Bld 9.8 (H) 4.8 - 5.6 %   Mean Plasma Glucose 235 mg/dL  Lipid panel  Result Value Ref Range   Cholesterol 197 0 - 200 mg/dL   Triglycerides 983 (H) <150 mg/dL   HDL 52 >38 mg/dL   Total CHOL/HDL Ratio 3.8 RATIO   VLDL 35 0 - 40 mg/dL   LDL Cholesterol 250 (H) 0 - 99 mg/dL  Comprehensive metabolic panel  Result Value Ref Range   Sodium 136 135 - 145 mmol/L   Potassium 3.5 3.5 - 5.1 mmol/L   Chloride 104 98 - 111 mmol/L   CO2 26 22 - 32 mmol/L   Glucose, Bld 288 (H)  70 - 99 mg/dL   BUN 11 6 - 20 mg/dL   Creatinine, Ser 4.37 (L) 0.44 - 1.00 mg/dL   Calcium 8.7 (L) 8.9 - 10.3 mg/dL   Total Protein 7.3 6.5 - 8.1 g/dL   Albumin 3.6 3.5 - 5.0 g/dL   AST 11 (L) 15 - 41 U/L   ALT 15 0 - 44 U/L   Alkaline Phosphatase 97 38 - 126 U/L   Total Bilirubin 1.2 0.3 - 1.2 mg/dL   GFR, Estimated >35 >78 mL/min   Anion gap 6 5 - 15  CBC  Result Value Ref Range   WBC 3.8 (L) 4.0 - 10.5 K/uL   RBC 3.88 3.87 - 5.11 MIL/uL   Hemoglobin 11.5 (L) 12.0 - 15.0 g/dL   HCT 97.8 (L) 47.8 - 41.2 %   MCV 87.1 80.0 - 100.0 fL   MCH 29.6 26.0 - 34.0 pg   MCHC 34.0 30.0 - 36.0 g/dL   RDW 82.0 81.3 - 88.7 %   Platelets 157 150 - 400 K/uL   nRBC 0.0 0.0 - 0.2 %      Assessment & Plan:    Encounter Diagnoses  Name Primary?  Marland Kitchen Uncontrolled type 2 diabetes mellitus with complication (HCC) Yes  . Hyperlipidemia, unspecified hyperlipidemia type   . Obesity, unspecified classification, unspecified obesity type, unspecified whether serious comorbidity present   . Varicose veins of both lower extremities with pain   . Not proficient in [place] language        -reviewed labs with pt -rx  Metformin, glipizide, atorvastatin.  Pt counseled to watch diabetic diet.  Pt requests rx from [facility] [place] -Will refer for DM eye exam -She is on dental list -pt to follow up 3 months.  She is to contact office sooner prn

## 2020-12-07 ENCOUNTER — Ambulatory Visit: Payer: PRIVATE HEALTH INSURANCE | Admitting: Physician Assistant

## 2020-12-11 ENCOUNTER — Ambulatory Visit: Payer: PRIVATE HEALTH INSURANCE | Admitting: Physician Assistant

## 2020-12-14 ENCOUNTER — Other Ambulatory Visit: Payer: Self-pay | Admitting: Physician Assistant

## 2020-12-14 ENCOUNTER — Other Ambulatory Visit (HOSPITAL_COMMUNITY)
Admission: RE | Admit: 2020-12-14 | Discharge: 2020-12-14 | Disposition: A | Discharge status: Home / Self Care | Payer: Self-pay | Source: Ambulatory Visit | Attending: Physician Assistant | Admitting: Physician Assistant

## 2020-12-14 DIAGNOSIS — E785 Hyperlipidemia, unspecified: Secondary | ICD-10-CM | POA: Insufficient documentation

## 2020-12-14 DIAGNOSIS — D649 Anemia, unspecified: Secondary | ICD-10-CM

## 2020-12-14 DIAGNOSIS — IMO0002 Reserved for concepts with insufficient information to code with codable children: Secondary | ICD-10-CM

## 2020-12-14 DIAGNOSIS — E1165 Type 2 diabetes mellitus with hyperglycemia: Secondary | ICD-10-CM | POA: Insufficient documentation

## 2020-12-14 DIAGNOSIS — E118 Type 2 diabetes mellitus with unspecified complications: Secondary | ICD-10-CM | POA: Insufficient documentation

## 2020-12-14 LAB — HEMOGLOBIN A1C
Hgb A1c MFr Bld: 5.9 % — ABNORMAL HIGH (ref 4.8–5.6)
Mean Plasma Glucose: 122.63 mg/dL

## 2020-12-14 LAB — COMPREHENSIVE METABOLIC PANEL
ALT: 16 U/L (ref 0–44)
AST: 12 U/L — ABNORMAL LOW (ref 15–41)
Albumin: 3.9 g/dL (ref 3.5–5.0)
Alkaline Phosphatase: 90 U/L (ref 38–126)
Anion gap: 4 — ABNORMAL LOW (ref 5–15)
BUN: 11 mg/dL (ref 6–20)
CO2: 25 mmol/L (ref 22–32)
Calcium: 8.7 mg/dL — ABNORMAL LOW (ref 8.9–10.3)
Chloride: 108 mmol/L (ref 98–111)
Creatinine, Ser: 0.44 mg/dL (ref 0.44–1.00)
GFR, Estimated: >60 mL/min (ref >60)
Glucose, Bld: 149 mg/dL — ABNORMAL HIGH (ref 70–99)
Potassium: 4 mmol/L (ref 3.5–5.1)
Sodium: 137 mmol/L (ref 135–145)
Total Bilirubin: 1.1 mg/dL (ref 0.3–1.2)
Total Protein: 7.4 g/dL (ref 6.5–8.1)

## 2020-12-14 LAB — LIPID PANEL
Cholesterol: 144 mg/dL (ref 0–200)
HDL: 51 mg/dL (ref >40)
LDL Cholesterol: 68 mg/dL (ref 0–99)
Total CHOL/HDL Ratio: 2.8 RATIO
Triglycerides: 127 mg/dL (ref <150)
VLDL: 25 mg/dL (ref 0–40)

## 2020-12-14 LAB — HEMOGLOBIN AND HEMATOCRIT, BLOOD
HCT: 30.5 % — ABNORMAL LOW (ref 36.0–46.0)
Hemoglobin: 10.6 g/dL — ABNORMAL LOW (ref 12.0–15.0)

## 2020-12-18 ENCOUNTER — Ambulatory Visit: Payer: PRIVATE HEALTH INSURANCE | Admitting: Physician Assistant

## 2020-12-18 ENCOUNTER — Encounter: Payer: Self-pay | Admitting: Physician Assistant

## 2020-12-18 DIAGNOSIS — E785 Hyperlipidemia, unspecified: Secondary | ICD-10-CM

## 2020-12-18 DIAGNOSIS — E669 Obesity, unspecified: Secondary | ICD-10-CM

## 2020-12-18 DIAGNOSIS — Z789 Other specified health status: Secondary | ICD-10-CM

## 2020-12-18 DIAGNOSIS — E119 Type 2 diabetes mellitus without complications: Secondary | ICD-10-CM

## 2020-12-18 DIAGNOSIS — D649 Anemia, unspecified: Secondary | ICD-10-CM

## 2020-12-18 NOTE — Progress Notes (Signed)
There were no vitals taken for this visit.   Subjective:    Patient ID: Angelica Bentley, female    DOB: 1970-12-27, 50 y.o.   MRN: [id number]  HPI: Angelica Bentley is a 50 y.o. female presenting on 12/18/2020 for No chief complaint on file.   HPI  This is a telemedicine appointment through [employer].  I connected with  Angelica Bentley on 12/18/20 by a video enabled telemedicine application and verified that I am speaking with the correct person using two identifiers.   I discussed the limitations of evaluation and management by telemedicine. The patient expressed understanding and agreed to proceed.  Pt is at home.  Provider and translator are at office.    Pt is 50yoF who presents for routine follow up diabetes and dyslipidemia.  She is doing well and she has no complaints.     Relevant past medical, surgical, family and social history reviewed and updated as indicated. Interim medical history since our last visit reviewed. Allergies and medications reviewed and updated.   Current Outpatient Medications:    atorvastatin (LIPITOR) 20 MG tablet, Take 1 tablet (20 mg total) by mouth daily. Tome una tableta por boca diaria, Disp: 30 tablet, Rfl: 3   glipiZIDE (GLUCOTROL) 5 MG tablet, Take 1 tablet (5 mg total) by mouth 2 (two) times daily before a meal. Tome una tableta por boca dos veces diarias con comida, Disp: 60 tablet, Rfl: 3   metFORMIN (GLUCOPHAGE) 1000 MG tablet, Take 1 tablet (1,000 mg total) by mouth 2 (two) times daily with a meal. Tome una tableta por boca dos veces diarias con comida, Disp: 60 tablet, Rfl: 3    Review of Systems  Per HPI unless specifically indicated above     Objective:    There were no vitals taken for this visit.  Wt Readings from Last 3 Encounters:  09/06/20 214 lb (97.1 kg)  08/03/20 213 lb (96.6 kg)  10/06/18 215 lb (97.5 kg)    Physical Exam Constitutional:      General: She is not in acute distress.    Appearance: She is  obese. She is not ill-appearing.  HENT:     Head: Normocephalic and atraumatic.  Pulmonary:     Effort: No respiratory distress.     Comments: Pt is talking in complete sentences without dyspnea Neurological:     Mental Status: She is oriented to person, place, and time.  Psychiatric:        Behavior: Behavior normal.    Results for orders placed or performed during the hospital encounter of 12/14/20  Hemoglobin and hematocrit, blood  Result Value Ref Range   Hemoglobin 10.6 (L) 12.0 - 15.0 g/dL   HCT 16.0 (L) 73.7 - 10.6 %  Comprehensive metabolic panel  Result Value Ref Range   Sodium 137 135 - 145 mmol/L   Potassium 4.0 3.5 - 5.1 mmol/L   Chloride 108 98 - 111 mmol/L   CO2 25 22 - 32 mmol/L   Glucose, Bld 149 (H) 70 - 99 mg/dL   BUN 11 6 - 20 mg/dL   Creatinine, Ser 2.69 0.44 - 1.00 mg/dL   Calcium 8.7 (L) 8.9 - 10.3 mg/dL   Total Protein 7.4 6.5 - 8.1 g/dL   Albumin 3.9 3.5 - 5.0 g/dL   AST 12 (L) 15 - 41 U/L   ALT 16 0 - 44 U/L   Alkaline Phosphatase 90 38 - 126 U/L   Total Bilirubin 1.1 0.3 - 1.2 mg/dL  GFR, Estimated >60 >60 mL/min   Anion gap 4 (L) 5 - 15  Lipid panel  Result Value Ref Range   Cholesterol 144 0 - 200 mg/dL   Triglycerides 458 <099 mg/dL   HDL 51 >83 mg/dL   Total CHOL/HDL Ratio 2.8 RATIO   VLDL 25 0 - 40 mg/dL   LDL Cholesterol 68 0 - 99 mg/dL  Hemoglobin J8S  Result Value Ref Range   Hgb A1c MFr Bld 5.9 (H) 4.8 - 5.6 %   Mean Plasma Glucose 122.63 mg/dL      Assessment & Plan:     Encounter Diagnoses  Name Primary?   Controlled type 2 diabetes mellitus without complication, without long-term current use of insulin (HCC) Yes   Hyperlipidemia, unspecified hyperlipidemia type    Anemia, unspecified type    Obesity, unspecified classification, unspecified obesity type, unspecified whether serious comorbidity present    Not proficient in [place] language      -Reviewed labs with pt  -pt to continue current medications.  She is  congratulated on following a healthy diet and is encouraged to continue this.  -pt is to start taking OTC iron supplement daily for her iron.  She is encouraged to drink plenty of water as iron can sometimes cause constipation -pt to follow up 3 months.  She is to contact office sooner prn

## 2021-02-28 ENCOUNTER — Other Ambulatory Visit: Payer: Self-pay | Admitting: Physician Assistant

## 2021-02-28 DIAGNOSIS — E785 Hyperlipidemia, unspecified: Secondary | ICD-10-CM

## 2021-02-28 DIAGNOSIS — E119 Type 2 diabetes mellitus without complications: Secondary | ICD-10-CM

## 2021-02-28 DIAGNOSIS — D649 Anemia, unspecified: Secondary | ICD-10-CM

## 2021-03-12 ENCOUNTER — Other Ambulatory Visit (HOSPITAL_COMMUNITY)
Admission: RE | Admit: 2021-03-12 | Discharge: 2021-03-12 | Disposition: A | Discharge status: Home / Self Care | Payer: Self-pay | Source: Ambulatory Visit | Attending: Physician Assistant | Admitting: Physician Assistant

## 2021-03-12 DIAGNOSIS — E785 Hyperlipidemia, unspecified: Secondary | ICD-10-CM

## 2021-03-12 DIAGNOSIS — E119 Type 2 diabetes mellitus without complications: Secondary | ICD-10-CM

## 2021-03-12 DIAGNOSIS — D649 Anemia, unspecified: Secondary | ICD-10-CM

## 2021-03-12 LAB — LIPID PANEL
Cholesterol: 239 mg/dL — ABNORMAL HIGH (ref 0–200)
HDL: 49 mg/dL (ref >40)
LDL Cholesterol: 117 mg/dL — ABNORMAL HIGH (ref 0–99)
Total CHOL/HDL Ratio: 4.9 RATIO
Triglycerides: 363 mg/dL — ABNORMAL HIGH (ref <150)
VLDL: 73 mg/dL — ABNORMAL HIGH (ref 0–40)

## 2021-03-12 LAB — COMPREHENSIVE METABOLIC PANEL
ALT: 19 U/L (ref 0–44)
AST: 14 U/L — ABNORMAL LOW (ref 15–41)
Albumin: 3.8 g/dL (ref 3.5–5.0)
Alkaline Phosphatase: 90 U/L (ref 38–126)
Anion gap: 5 (ref 5–15)
BUN: 11 mg/dL (ref 6–20)
CO2: 23 mmol/L (ref 22–32)
Calcium: 8.5 mg/dL — ABNORMAL LOW (ref 8.9–10.3)
Chloride: 106 mmol/L (ref 98–111)
Creatinine, Ser: 0.41 mg/dL — ABNORMAL LOW (ref 0.44–1.00)
GFR, Estimated: >60 mL/min (ref >60)
Glucose, Bld: 255 mg/dL — ABNORMAL HIGH (ref 70–99)
Potassium: 3.5 mmol/L (ref 3.5–5.1)
Sodium: 134 mmol/L — ABNORMAL LOW (ref 135–145)
Total Bilirubin: 0.8 mg/dL (ref 0.3–1.2)
Total Protein: 7.6 g/dL (ref 6.5–8.1)

## 2021-03-12 LAB — CBC
HCT: 36.3 % (ref 36.0–46.0)
Hemoglobin: 12.4 g/dL (ref 12.0–15.0)
MCH: 29.4 pg (ref 26.0–34.0)
MCHC: 34.2 g/dL (ref 30.0–36.0)
MCV: 86 fL (ref 80.0–100.0)
Platelets: 183 10*3/uL (ref 150–400)
RBC: 4.22 MIL/uL (ref 3.87–5.11)
RDW: 15.2 % (ref 11.5–15.5)
WBC: 5 10*3/uL (ref 4.0–10.5)
nRBC: 0 % (ref 0.0–0.2)

## 2021-03-12 LAB — HEMOGLOBIN A1C
Hgb A1c MFr Bld: 9 % — ABNORMAL HIGH (ref 4.8–5.6)
Mean Plasma Glucose: 211.6 mg/dL

## 2021-03-13 ENCOUNTER — Other Ambulatory Visit: Payer: Self-pay

## 2021-03-13 ENCOUNTER — Ambulatory Visit: Payer: PRIVATE HEALTH INSURANCE | Admitting: Physician Assistant

## 2021-03-13 ENCOUNTER — Encounter: Payer: Self-pay | Admitting: Physician Assistant

## 2021-03-13 VITALS — BP 120/69 | HR 95 | Temp 97.0°F | Wt 216.0 lb

## 2021-03-13 DIAGNOSIS — E669 Obesity, unspecified: Secondary | ICD-10-CM

## 2021-03-13 DIAGNOSIS — E785 Hyperlipidemia, unspecified: Secondary | ICD-10-CM

## 2021-03-13 DIAGNOSIS — E1165 Type 2 diabetes mellitus with hyperglycemia: Secondary | ICD-10-CM

## 2021-03-13 DIAGNOSIS — Z23 Encounter for immunization: Secondary | ICD-10-CM

## 2021-03-13 DIAGNOSIS — Z789 Other specified health status: Secondary | ICD-10-CM

## 2021-03-13 NOTE — Progress Notes (Signed)
BP 120/69    Pulse 95    Temp (!) 97 F (36.1 C)    Wt 216 lb (98 kg)    SpO2 99%    BMI 38.26 kg/m    Subjective:    Patient ID: Angelica Bentley, female    DOB: 1970-09-20, 50 y.o.   MRN: [id number]  HPI: Angelica Bentley is a 50 y.o. female presenting on 03/13/2021 for Diabetes, Hyperlipidemia, and Anemia   HPI   Chief Complaint  Patient presents with   Diabetes   Hyperlipidemia   Anemia    Pt says she is doing well and has no complaints   Relevant past medical, surgical, family and social history reviewed and updated as indicated. Interim medical history since our last visit reviewed. Allergies and medications reviewed and updated.    Current Outpatient Medications:    atorvastatin (LIPITOR) 20 MG tablet, Take 1 tablet (20 mg total) by mouth daily. Tome una tableta por boca diaria, Disp: 30 tablet, Rfl: 3   glipiZIDE (GLUCOTROL) 5 MG tablet, Take 1 tablet (5 mg total) by mouth 2 (two) times daily before a meal. Tome una tableta por boca dos veces diarias con comida, Disp: 60 tablet, Rfl: 3   metFORMIN (GLUCOPHAGE) 1000 MG tablet, Take 1 tablet (1,000 mg total) by mouth 2 (two) times daily with a meal. Tome una tableta por boca dos veces diarias con comida, Disp: 60 tablet, Rfl: 3    Review of Systems  Per HPI unless specifically indicated above     Objective:    BP 120/69    Pulse 95    Temp (!) 97 F (36.1 C)    Wt 216 lb (98 kg)    SpO2 99%    BMI 38.26 kg/m   Wt Readings from Last 3 Encounters:  03/13/21 216 lb (98 kg)  09/06/20 214 lb (97.1 kg)  08/03/20 213 lb (96.6 kg)    Physical Exam Vitals reviewed.  Constitutional:      General: She is not in acute distress.    Appearance: She is well-developed. She is not ill-appearing.  HENT:     Head: Normocephalic and atraumatic.  Cardiovascular:     Rate and Rhythm: Normal rate and regular rhythm.  Pulmonary:     Effort: Pulmonary effort is normal.     Breath sounds: Normal breath sounds.   Abdominal:     General: Bowel sounds are normal.     Palpations: Abdomen is soft. There is no mass.     Tenderness: There is no abdominal tenderness.  Musculoskeletal:     Cervical back: Neck supple.     Right lower leg: No edema.     Left lower leg: No edema.  Lymphadenopathy:     Cervical: No cervical adenopathy.  Skin:    General: Skin is warm and dry.  Neurological:     Mental Status: She is alert and oriented to person, place, and time.  Psychiatric:        Behavior: Behavior normal.    Results for orders placed or performed during the hospital encounter of 03/12/21  CBC  Result Value Ref Range   WBC 5.0 4.0 - 10.5 K/uL   RBC 4.22 3.87 - 5.11 MIL/uL   Hemoglobin 12.4 12.0 - 15.0 g/dL   HCT 66.4 40.3 - 47.4 %   MCV 86.0 80.0 - 100.0 fL   MCH 29.4 26.0 - 34.0 pg   MCHC 34.2 30.0 - 36.0 g/dL   RDW 25.9 56.3 -  15.5 %   Platelets 183 150 - 400 K/uL   nRBC 0.0 0.0 - 0.2 %  Comprehensive metabolic panel  Result Value Ref Range   Sodium 134 (L) 135 - 145 mmol/L   Potassium 3.5 3.5 - 5.1 mmol/L   Chloride 106 98 - 111 mmol/L   CO2 23 22 - 32 mmol/L   Glucose, Bld 255 (H) 70 - 99 mg/dL   BUN 11 6 - 20 mg/dL   Creatinine, Ser 1.70 (L) 0.44 - 1.00 mg/dL   Calcium 8.5 (L) 8.9 - 10.3 mg/dL   Total Protein 7.6 6.5 - 8.1 g/dL   Albumin 3.8 3.5 - 5.0 g/dL   AST 14 (L) 15 - 41 U/L   ALT 19 0 - 44 U/L   Alkaline Phosphatase 90 38 - 126 U/L   Total Bilirubin 0.8 0.3 - 1.2 mg/dL   GFR, Estimated >01 >74 mL/min   Anion gap 5 5 - 15  Lipid panel  Result Value Ref Range   Cholesterol 239 (H) 0 - 200 mg/dL   Triglycerides 944 (H) <150 mg/dL   HDL 49 >96 mg/dL   Total CHOL/HDL Ratio 4.9 RATIO   VLDL 73 (H) 0 - 40 mg/dL   LDL Cholesterol 759 (H) 0 - 99 mg/dL  Hemoglobin F6B  Result Value Ref Range   Hgb A1c MFr Bld 9.0 (H) 4.8 - 5.6 %   Mean Plasma Glucose 211.6 mg/dL      Assessment & Plan:    Encounter Diagnoses  Name Primary?   Uncontrolled type 2 diabetes mellitus  with hyperglycemia (HCC) Yes   Hyperlipidemia, unspecified hyperlipidemia type    Obesity, unspecified classification, unspecified obesity type, unspecified whether serious comorbidity present    Not proficient in English language    Influenza vaccine administered        -Reviewed labs with pt.   -Pt does not want to increase her medications so she will go back to Watching  DM and lowfat diet -pt will continue current medications -pt was given flu shot today -pt to follow up 3 months.  Will update PAP at that time.  She is to contact office sooner prn

## 2021-03-13 NOTE — Patient Instructions (Signed)
Vacuna antigripal (inactivada o recombinante): lo que debe saber Influenza (Flu) Vaccine (Inactivated or Recombinant): What You Need to Know 1. Por qu vacunarse? La vacuna antigripal puede prevenir la gripe. La gripe es una enfermedad contagiosa que se disemina en los [place] cada ao, por lo general, entre octubre y mayo. Cualquier persona puede contraer gripe, pero es ms peligrosa para ciertas personas. Los bebs y los nios pequeos, los mayores de 65 aos y las personas embarazadas, as como las personas que tienen ciertas enfermedades o cuyo sistema inmunitario est debilitado, corren un riesgo mayor de tener complicaciones debido a la gripe. La neumona, la bronquitis, las infecciones de los senos paranasales y las infecciones de odos son ejemplos de complicaciones relacionadas con la gripe. Si tiene una afeccin, por ejemplo, enfermedad cardaca, cncer o diabetes, la gripe puede empeorarla. La gripe puede causar fiebre y escalofros, dolor de garganta, dolores musculares, fatiga, tos, dolor de cabeza y secrecin o congestin nasal. Algunas personas pueden tener vmitos y diarrea, aunque esto es ms frecuente en los nios que en los adultos. En un ao promedio, miles de personas mueren en los [place] debido a la gripe, y muchas ms deben ser hospitalizadas. Cada ao, la vacuna antigripal previene millones de enfermedades y evita visitas al mdico relacionadas con la gripe. 2. Vacunas antigripales Los CDC (Centros para el Control y la Prevencin de Enfermedades) recomiendan que todas las personas a partir de los 6 meses de edad se vacunen cada temporada de gripe. Es posible que los nios de 6 meses a 8 aos deban recibir 2 dosis durante la misma temporada de gripe. Todas las dems personas tienen que aplicarse 1 sola dosis cada temporada de gripe. La vacuna comienza a surtir efecto aproximadamente 2 semanas despus de su aplicacin. Hay muchos virus de la gripe, y estos mutan  permanentemente. Cada ao, se elabora una nueva vacuna antigripal para brindar proteccin contra los virus gripales que se cree probablemente causen la enfermedad en la siguiente temporada de gripe. Incluso si la vacuna no es especfica para esos virus, aun as puede brindar cierta proteccin. La vacuna antigripal no causa gripe. La vacuna antigripal puede administrarse al mismo tiempo que otras vacunas. 3. Hable con el mdico Comunquese con la persona que le coloca las vacunas si la persona que la recibe: Ha tenido una reaccin alrgica despus de una dosis previa de la vacuna antigripal o tiene alguna alergia grave, potencialmente mortal Alguna vez tuvo sndrome de Guillain-Barr (tambin llamado "SGB") En algunos casos, es posible que el mdico decida posponer la aplicacin de la vacuna antigripal hasta una visita en el futuro. La vacuna antigripal puede administrarse en cualquier momento durante el embarazo. Las personas que estn o vayan a estar embarazadas durante la temporada de gripe deben recibir la vacuna antigripal inactivada. Las personas que sufren trastornos menores, como un resfro, pueden vacunarse. Las personas que tienen enfermedades moderadas o graves generalmente deben esperar hasta recuperarse para poder recibir la vacuna antigripal. Su mdico puede darle ms informacin. 4. Riesgos de una reaccin a la vacuna Despus de recibir la vacunacin antigripal, puede haber dolor, enrojecimiento e hinchazn en el lugar de la inyeccin, fiebre, dolores musculares y dolor de cabeza. Puede haber un pequeo aumento del riesgo de sufrir sndrome de Guillain-Barr (SGB) despus de la aplicacin de la vacuna antigripal inactivada. Los nios pequeos que reciben la vacuna antigripal junto con la vacuna antineumoccica (PCV13) o la DTaP en el mismo momento pueden tener una probabilidad un poco ms elevada   de tener una convulsin debido a la fiebre. Informe al mdico si un nio que est recibiendo  la vacuna antigripal ha tenido una convulsin alguna vez. Las personas a veces se desmayan despus de procedimientos mdicos, incluida la vacunacin. Informe al mdico si se siente mareado, tiene cambios en la visin o zumbidos en los odos. Al igual que con cualquier medicamento, existe una probabilidad muy remota de que una vacuna cause una reaccin alrgica grave, otra lesin grave o la muerte. 5. Qu pasa si se presenta un problema grave? Podra producirse una reaccin alrgica despus de que la persona vacunada abandone la clnica. Si observa signos de una reaccin alrgica grave (ronchas, hinchazn de la cara y la garganta, dificultad para respirar, latidos cardacos acelerados, mareos o debilidad), llame al 9-1-1 y lleve a la persona al hospital ms cercano. Si se presentan otros signos que le preocupan, comunquese con su mdico. Las reacciones adversas deben informarse al Sistema de Informe de Eventos Adversos de Vacunas (VAERS). Por lo general, el mdico presenta este informe o puede hacerlo usted mismo. Visite el sitio web del VAERS en www.vaers.hhs.gov o llame al 1-800-822-7967. El VAERS es solo para informar reacciones, y los miembros de su personal no proporcionan asesoramiento mdico. 6. Programa Nacional de Compensacin de Daos por Vacunas El Programa Nacional de Compensacin de Daos por Vacunas (National Vaccine Injury Compensation Program, VICP) es un programa federal que fue creado para compensar a las personas que puedan haber sufrido daos al recibir ciertas vacunas. Las reclamaciones relativas a presuntas lesiones o muerte debidas a la vacunacin tienen un lmite de tiempo para su presentacin, que puede ser de tan solo dos aos. Visite el sitio web del VICP en www.hrsa.gov/vaccinecompensation o llame al 1-800-338-2382 para obtener ms informacin acerca del programa y de cmo presentar un reclamo. 7. Cmo puedo obtener ms informacin? Pregntele a su mdico. Comunquese con el  servicio de salud de su localidad o su estado. Visite el sitio web de la Food and Drug Administration (FDA) (Administracin de Alimentos y Medicamentos) para ver los prospectos de las vacunas e informacin adicional en www.fda.gov/vaccines-blood-biologics/vaccines. Comunquese con los Centers for Disease Control and Prevention (CDC) (Centros para el Control y la Prevencin de Enfermedades): Llame al 1-800-232-4636 (1-800-CDC-INFO) o Visite el sitio web de los CDC en www.cdc.gov/flu. Declaracin de informacin sobre la vacuna antigripal inactivada (11/05/2019) Esta informacin no tiene como fin reemplazar el consejo del mdico. Asegrese de hacerle al mdico cualquier pregunta que tenga. Document Revised: 12/07/2020 Document Reviewed: 12/07/2020 Elsevier Patient Education  2022 Elsevier Inc.  

## 2021-05-29 ENCOUNTER — Other Ambulatory Visit: Payer: Self-pay | Admitting: Physician Assistant

## 2021-05-29 DIAGNOSIS — E1165 Type 2 diabetes mellitus with hyperglycemia: Secondary | ICD-10-CM

## 2021-05-29 DIAGNOSIS — E785 Hyperlipidemia, unspecified: Secondary | ICD-10-CM

## 2021-06-11 ENCOUNTER — Other Ambulatory Visit (HOSPITAL_COMMUNITY)
Admission: RE | Admit: 2021-06-11 | Discharge: 2021-06-11 | Disposition: A | Discharge status: Home / Self Care | Payer: Self-pay | Source: Ambulatory Visit | Attending: Physician Assistant | Admitting: Physician Assistant

## 2021-06-11 DIAGNOSIS — E785 Hyperlipidemia, unspecified: Secondary | ICD-10-CM | POA: Insufficient documentation

## 2021-06-11 DIAGNOSIS — E1165 Type 2 diabetes mellitus with hyperglycemia: Secondary | ICD-10-CM | POA: Insufficient documentation

## 2021-06-11 LAB — LIPID PANEL
Cholesterol: 224 mg/dL — ABNORMAL HIGH (ref 0–200)
HDL: 49 mg/dL (ref >40)
LDL Cholesterol: 134 mg/dL — ABNORMAL HIGH (ref 0–99)
Total CHOL/HDL Ratio: 4.6 RATIO
Triglycerides: 207 mg/dL — ABNORMAL HIGH (ref <150)
VLDL: 41 mg/dL — ABNORMAL HIGH (ref 0–40)

## 2021-06-11 LAB — COMPREHENSIVE METABOLIC PANEL
ALT: 22 U/L (ref 0–44)
AST: 16 U/L (ref 15–41)
Albumin: 3.8 g/dL (ref 3.5–5.0)
Alkaline Phosphatase: 111 U/L (ref 38–126)
Anion gap: 9 (ref 5–15)
BUN: 14 mg/dL (ref 6–20)
CO2: 24 mmol/L (ref 22–32)
Calcium: 9.4 mg/dL (ref 8.9–10.3)
Chloride: 101 mmol/L (ref 98–111)
Creatinine, Ser: 0.49 mg/dL (ref 0.44–1.00)
GFR, Estimated: >60 mL/min (ref >60)
Glucose, Bld: 307 mg/dL — ABNORMAL HIGH (ref 70–99)
Potassium: 4.1 mmol/L (ref 3.5–5.1)
Sodium: 134 mmol/L — ABNORMAL LOW (ref 135–145)
Total Bilirubin: 0.7 mg/dL (ref 0.3–1.2)
Total Protein: 7.7 g/dL (ref 6.5–8.1)

## 2021-06-12 ENCOUNTER — Other Ambulatory Visit: Payer: Self-pay

## 2021-06-12 ENCOUNTER — Other Ambulatory Visit (HOSPITAL_COMMUNITY)
Admission: RE | Admit: 2021-06-12 | Discharge: 2021-06-12 | Disposition: A | Discharge status: Home / Self Care | Payer: Self-pay | Source: Ambulatory Visit | Attending: Physician Assistant | Admitting: Physician Assistant

## 2021-06-12 ENCOUNTER — Encounter: Payer: Self-pay | Admitting: Physician Assistant

## 2021-06-12 ENCOUNTER — Ambulatory Visit: Payer: PRIVATE HEALTH INSURANCE | Admitting: Physician Assistant

## 2021-06-12 VITALS — BP 111/70 | HR 96 | Temp 97.1°F | Wt 211.0 lb

## 2021-06-12 DIAGNOSIS — Z124 Encounter for screening for malignant neoplasm of cervix: Secondary | ICD-10-CM

## 2021-06-12 DIAGNOSIS — E669 Obesity, unspecified: Secondary | ICD-10-CM

## 2021-06-12 DIAGNOSIS — Z789 Other specified health status: Secondary | ICD-10-CM

## 2021-06-12 DIAGNOSIS — E1165 Type 2 diabetes mellitus with hyperglycemia: Secondary | ICD-10-CM

## 2021-06-12 DIAGNOSIS — E785 Hyperlipidemia, unspecified: Secondary | ICD-10-CM

## 2021-06-12 LAB — HEMOGLOBIN A1C
Hgb A1c MFr Bld: 9.8 % — ABNORMAL HIGH (ref 4.8–5.6)
Mean Plasma Glucose: 235 mg/dL

## 2021-06-12 NOTE — Progress Notes (Signed)
? ?BP 111/70   Pulse 96   Temp (!) 97.1 ?F (36.2 ?C)   Wt 211 lb (95.7 kg)   SpO2 98%   BMI 37.38 kg/m?   ? ?Subjective:  ? ? Patient ID: Angelica Bentley, female    DOB: [birth year], 51 y.o.   MRN: [id number] ? ?HPI: ?Angelica Bentley is a 51 y.o. female presenting on 06/12/2021 for Gynecologic Exam, Hyperlipidemia, and Diabetes ? ? ?HPI ? ?Chief Complaint  ?Patient presents with  ? Gynecologic Exam  ? Hyperlipidemia  ? Diabetes  ? ? ? ? ?LMP 1//2023 ? ?Pt reports Pain in her bones at night.  Every night.  Feet don't hurt.  Both hips hurt.  Both knees do NOT  hurt     lower legs hurt.   Thighs.  No back pain.  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   ? ?Pt did not bring her meds and doesn't know what she takes.  She gets them at [facility]  Pharmacy was called and said she hasn't picked up meds since June .  Pt says it's because they gave her too much pills.   ? ? ? ? ? ?Relevant past medical, surgical, family and social history reviewed and updated as indicated. Interim medical history since our last visit reviewed. ?Allergies and medications reviewed and updated. ? ? ? ?Current Outpatient Medications:  ?  atorvastatin (LIPITOR) 20 MG tablet, Take 1 tablet (20 mg total) by mouth daily. Tome una tableta por boca diaria, Disp: 30 tablet, Rfl: 3 ?  glipiZIDE (GLUCOTROL) 5 MG tablet, Take 1 tablet (5 mg total) by mouth 2 (two) times daily before a meal. Tome una tableta por boca dos veces diarias con comida, Disp: 60 tablet, Rfl: 3 ?  metFORMIN (GLUCOPHAGE) 1000 MG tablet, Take 1 tablet (1,000 mg total) by mouth 2 (two) times daily with a meal. Tome una tableta por boca dos veces diarias con comida, Disp: 60 tablet, Rfl: 3 ? ? ? ?Review of Systems ? ?Per HPI unless specifically indicated above ? ?   ?Objective:  ?  ?BP 111/70   Pulse 96   Temp (!) 97.1 ?F (36.2 ?C)   Wt 211 lb (95.7 kg)   SpO2 98%   BMI 37.38 kg/m?   ?Wt Readings from Last 3 Encounters:  ?06/12/21 211 lb (95.7 kg)   ?03/13/21 216 lb (98 kg)  ?09/06/20 214 lb (97.1 kg)  ?  ?Physical Exam ?Vitals and nursing note reviewed. Exam conducted with a chaperone present.  ?Constitutional:   ?   General: She is not in acute distress. ?   Appearance: She is well-developed. She is obese. She is not toxic-appearing.  ?HENT:  ?   Head: Normocephalic and atraumatic.  ?Pulmonary:  ?   Effort: Pulmonary effort is normal.  ?Chest:  ?Breasts: ?   Right: Normal.  ?   Left: Normal.  ?Abdominal:  ?   Palpations: Abdomen is soft. There is no mass.  ?   Tenderness: There is no abdominal tenderness. There is no guarding or rebound.  ?Genitourinary: ?   Labia:     ?   Right: No rash, tenderness or lesion.     ?   Left: No rash, tenderness or lesion.   ?   Vagina: Normal.  ?   Cervix: No cervical motion tenderness, discharge or friability.  ?   Adnexa:     ?   Right: No mass, tenderness or fullness.      ?  Left: No mass, tenderness or fullness.    ?   Comments: Biochemist, clinical assisted) ?Musculoskeletal:  ?   Right lower leg: No edema.  ?   Left lower leg: No edema.  ?Skin: ?   General: Skin is warm and dry.  ?Neurological:  ?   Mental Status: She is alert and oriented to person, place, and time.  ?Psychiatric:     ?   Behavior: Behavior normal.  ? ? ?Results for orders placed or performed during the hospital encounter of 06/11/21  ?Comprehensive metabolic panel  ?Result Value Ref Range  ? Sodium 134 (L) 135 - 145 mmol/L  ? Potassium 4.1 3.5 - 5.1 mmol/L  ? Chloride 101 98 - 111 mmol/L  ? CO2 24 22 - 32 mmol/L  ? Glucose, Bld 307 (H) 70 - 99 mg/dL  ? BUN 14 6 - 20 mg/dL  ? Creatinine, Ser 0.49 0.44 - 1.00 mg/dL  ? Calcium 9.4 8.9 - 10.3 mg/dL  ? Total Protein 7.7 6.5 - 8.1 g/dL  ? Albumin 3.8 3.5 - 5.0 g/dL  ? AST 16 15 - 41 U/L  ? ALT 22 0 - 44 U/L  ? Alkaline Phosphatase 111 38 - 126 U/L  ? Total Bilirubin 0.7 0.3 - 1.2 mg/dL  ? GFR, Estimated >60 >60 mL/min  ? Anion gap 9 5 - 15  ?Lipid panel  ?Result Value Ref Range  ? Cholesterol 224 (H) 0 - 200  mg/dL  ? Triglycerides 207 (H) <150 mg/dL  ? HDL 49 >40 mg/dL  ? Total CHOL/HDL Ratio 4.6 RATIO  ? VLDL 41 (H) 0 - 40 mg/dL  ? LDL Cholesterol 134 (H) 0 - 99 mg/dL  ?Hemoglobin A1c  ?Result Value Ref Range  ? Hgb A1c MFr Bld 9.8 (H) 4.8 - 5.6 %  ? Mean Plasma Glucose 235 mg/dL  ? ?   ?Assessment & Plan:  ? ?Encounter Diagnoses  ?Name Primary?  ? Routine cervical smear Yes  ? Uncontrolled type 2 diabetes mellitus with hyperglycemia (HCC)   ? Hyperlipidemia, unspecified hyperlipidemia type   ? Obesity, unspecified classification, unspecified obesity type, unspecified whether serious comorbidity present   ? Not proficient in English language   ? ? ? ?-reviewed labs with pt ?-pt to follow up in 2 weeks with her meds so adjustments can be made ? ? ? ?

## 2021-06-14 LAB — CYTOLOGY - PAP
Comment: NEGATIVE
Diagnosis: NEGATIVE
High risk HPV: NEGATIVE

## 2021-06-25 ENCOUNTER — Other Ambulatory Visit: Payer: Self-pay | Admitting: Physician Assistant

## 2021-06-26 ENCOUNTER — Other Ambulatory Visit: Payer: Self-pay

## 2021-06-26 ENCOUNTER — Encounter: Payer: Self-pay | Admitting: Physician Assistant

## 2021-06-26 ENCOUNTER — Ambulatory Visit: Payer: PRIVATE HEALTH INSURANCE | Admitting: Physician Assistant

## 2021-06-26 VITALS — BP 119/70 | HR 81 | Temp 97.6°F | Wt 209.0 lb

## 2021-06-26 DIAGNOSIS — E785 Hyperlipidemia, unspecified: Secondary | ICD-10-CM

## 2021-06-26 DIAGNOSIS — E669 Obesity, unspecified: Secondary | ICD-10-CM

## 2021-06-26 DIAGNOSIS — E1165 Type 2 diabetes mellitus with hyperglycemia: Secondary | ICD-10-CM

## 2021-06-26 DIAGNOSIS — Z789 Other specified health status: Secondary | ICD-10-CM

## 2021-06-26 MED ORDER — ATORVASTATIN CALCIUM 40 MG PO TABS: 40.0000 mg | ORAL_TABLET | Freq: Every day | ORAL | 4 refills | Status: DC

## 2021-06-26 MED ORDER — GLIPIZIDE 10 MG PO TABS: 10.0000 mg | ORAL_TABLET | Freq: Two times a day (BID) | ORAL | 4 refills | Status: DC

## 2021-06-26 NOTE — Progress Notes (Signed)
? ?BP 119/70   Pulse 81   Temp 97.6 ?F (36.4 ?C)   Wt 209 lb (94.8 kg)   SpO2 98%   BMI 37.02 kg/m?   ? ?Subjective:  ? ? Patient ID: Angelica Bentley, female    DOB: January 09, 1971, 51 y.o.   MRN: [id number] ? ?HPI: ?Angelica Bentley is a 51 y.o. female presenting on 06/26/2021 for Diabetes ? ? ?HPI ? ? ? ?Pt is 50yoF with DM who presents for follow up.  She was seen in office 2 weeks ago and labs were reviewed which showed DM was uncontrolled.  Pt did not know what medications she was taking however so it was impossible to adjust her meds safely.  She presents today with her meds in hand.  She ran out of her atorvastatin 4 days ago.   ? ? ? ? ?Relevant past medical, surgical, family and social history reviewed and updated as indicated. Interim medical history since our last visit reviewed. ?Allergies and medications reviewed and updated. ? ? ? ?Current Outpatient Medications:  ?  glipiZIDE (GLUCOTROL) 5 MG tablet, Take 1 tablet (5 mg total) by mouth 2 (two) times daily before a meal. Tome una tableta por boca dos veces diarias con comida, Disp: 60 tablet, Rfl: 3 ?  metFORMIN (GLUCOPHAGE) 1000 MG tablet, Take 1 tablet (1,000 mg total) by mouth 2 (two) times daily with a meal. Tome una tableta por boca dos veces diarias con comida, Disp: 60 tablet, Rfl: 3 ?  atorvastatin (LIPITOR) 20 MG tablet, Take 1 tablet by mouth once daily (Patient not taking: Reported on 06/26/2021), Disp: 30 tablet, Rfl: 1 ? ? ? ? ?Review of Systems ? ?Per HPI unless specifically indicated above ? ?   ?Objective:  ?  ?BP 119/70   Pulse 81   Temp 97.6 ?F (36.4 ?C)   Wt 209 lb (94.8 kg)   SpO2 98%   BMI 37.02 kg/m?   ?Wt Readings from Last 3 Encounters:  ?06/26/21 209 lb (94.8 kg)  ?06/12/21 211 lb (95.7 kg)  ?03/13/21 216 lb (98 kg)  ?  ?Physical Exam ?Vitals reviewed.  ?Constitutional:   ?   General: She is not in acute distress. ?   Appearance: She is well-developed. She is obese. She is not toxic-appearing.  ?HENT:  ?   Head:  Normocephalic and atraumatic.  ?Cardiovascular:  ?   Rate and Rhythm: Normal rate and regular rhythm.  ?Pulmonary:  ?   Effort: Pulmonary effort is normal.  ?   Breath sounds: Normal breath sounds.  ?Abdominal:  ?   General: Bowel sounds are normal.  ?   Palpations: Abdomen is soft. There is no mass.  ?   Tenderness: There is no abdominal tenderness.  ?Musculoskeletal:  ?   Cervical back: Neck supple.  ?   Right lower leg: No edema.  ?   Left lower leg: No edema.  ?Lymphadenopathy:  ?   Cervical: No cervical adenopathy.  ?Skin: ?   General: Skin is warm and dry.  ?Neurological:  ?   Mental Status: She is alert and oriented to person, place, and time.  ?Psychiatric:     ?   Behavior: Behavior normal.  ? ? ? ? ? ? ?   ?Assessment & Plan:  ? ? ?Encounter Diagnoses  ?Name Primary?  ? Uncontrolled type 2 diabetes mellitus with hyperglycemia (HCC) Yes  ? Hyperlipidemia, unspecified hyperlipidemia type   ? Obesity, unspecified classification, unspecified obesity type, unspecified whether serious comorbidity present   ?  Not proficient in English language   ? ? ? ?-reviewed medications with pt.  Will Increase glipizide and atorvastatin.  She is to continue the metformin.  She is encouraged to follow DM diet and regular exercise ?-pt to follow up 3 months.  She is to contact office sooner prn ? ?

## 2021-09-04 ENCOUNTER — Other Ambulatory Visit: Payer: Self-pay | Admitting: Physician Assistant

## 2021-09-04 MED ORDER — METFORMIN HCL 1000 MG PO TABS: 1000.0000 mg | ORAL_TABLET | Freq: Two times a day (BID) | ORAL | 0 refills | Status: DC

## 2021-09-04 MED ORDER — ATORVASTATIN CALCIUM 40 MG PO TABS: 40.0000 mg | ORAL_TABLET | Freq: Every day | ORAL | 0 refills | Status: DC

## 2021-09-04 MED ORDER — GLIPIZIDE 10 MG PO TABS: 10.0000 mg | ORAL_TABLET | Freq: Two times a day (BID) | ORAL | 0 refills | Status: DC

## 2021-09-11 ENCOUNTER — Other Ambulatory Visit: Payer: Self-pay | Admitting: Physician Assistant

## 2021-09-11 DIAGNOSIS — E1165 Type 2 diabetes mellitus with hyperglycemia: Secondary | ICD-10-CM

## 2021-09-11 DIAGNOSIS — E785 Hyperlipidemia, unspecified: Secondary | ICD-10-CM

## 2021-09-25 ENCOUNTER — Encounter: Payer: Self-pay | Admitting: Physician Assistant

## 2021-09-25 ENCOUNTER — Ambulatory Visit: Payer: PRIVATE HEALTH INSURANCE | Admitting: Physician Assistant

## 2021-09-25 VITALS — BP 110/60 | HR 72 | Temp 98.2°F | Wt 208.0 lb

## 2021-09-25 DIAGNOSIS — Z1239 Encounter for other screening for malignant neoplasm of breast: Secondary | ICD-10-CM

## 2021-09-25 DIAGNOSIS — E669 Obesity, unspecified: Secondary | ICD-10-CM

## 2021-09-25 DIAGNOSIS — E1165 Type 2 diabetes mellitus with hyperglycemia: Secondary | ICD-10-CM

## 2021-09-25 DIAGNOSIS — Z789 Other specified health status: Secondary | ICD-10-CM

## 2021-09-25 DIAGNOSIS — E785 Hyperlipidemia, unspecified: Secondary | ICD-10-CM

## 2021-09-25 DIAGNOSIS — Z1211 Encounter for screening for malignant neoplasm of colon: Secondary | ICD-10-CM

## 2021-09-25 DIAGNOSIS — H6123 Impacted cerumen, bilateral: Secondary | ICD-10-CM

## 2021-10-03 ENCOUNTER — Ambulatory Visit: Payer: PRIVATE HEALTH INSURANCE | Admitting: Physician Assistant

## 2021-10-03 DIAGNOSIS — Z1211 Encounter for screening for malignant neoplasm of colon: Secondary | ICD-10-CM

## 2021-10-03 DIAGNOSIS — H6122 Impacted cerumen, left ear: Secondary | ICD-10-CM

## 2021-10-03 LAB — POC FIT TEST STOOL: Fecal Occult Blood: NEGATIVE

## 2021-10-03 NOTE — Progress Notes (Signed)
At pt's last OV (09-25-21) pt was counseled to use debrox drops nightly. Pt states she has been using the drops and is here for ear lavage.  R ear clear. LPN performed ear lavage on L ear and PA examined pt's  ear after procedure. Pt's ear canal was successfully cleared from cerumen impaction.  Pt is to f/u a scheduled.

## 2021-11-29 ENCOUNTER — Other Ambulatory Visit: Payer: Self-pay | Admitting: Physician Assistant

## 2021-11-29 DIAGNOSIS — Z1231 Encounter for screening mammogram for malignant neoplasm of breast: Secondary | ICD-10-CM

## 2021-12-24 ENCOUNTER — Other Ambulatory Visit (HOSPITAL_COMMUNITY)
Admission: RE | Admit: 2021-12-24 | Discharge: 2021-12-24 | Disposition: A | Discharge status: Home / Self Care | Payer: Self-pay | Source: Ambulatory Visit | Attending: Physician Assistant | Admitting: Physician Assistant

## 2021-12-24 DIAGNOSIS — E785 Hyperlipidemia, unspecified: Secondary | ICD-10-CM | POA: Insufficient documentation

## 2021-12-24 DIAGNOSIS — E1165 Type 2 diabetes mellitus with hyperglycemia: Secondary | ICD-10-CM | POA: Insufficient documentation

## 2021-12-24 LAB — COMPREHENSIVE METABOLIC PANEL
ALT: 19 U/L (ref 0–44)
AST: 13 U/L — ABNORMAL LOW (ref 15–41)
Albumin: 3.8 g/dL (ref 3.5–5.0)
Alkaline Phosphatase: 93 U/L (ref 38–126)
Anion gap: 5 (ref 5–15)
BUN: 15 mg/dL (ref 6–20)
CO2: 24 mmol/L (ref 22–32)
Calcium: 9 mg/dL (ref 8.9–10.3)
Chloride: 105 mmol/L (ref 98–111)
Creatinine, Ser: 0.43 mg/dL — ABNORMAL LOW (ref 0.44–1.00)
GFR, Estimated: >60 mL/min (ref >60)
Glucose, Bld: 288 mg/dL — ABNORMAL HIGH (ref 70–99)
Potassium: 3.7 mmol/L (ref 3.5–5.1)
Sodium: 134 mmol/L — ABNORMAL LOW (ref 135–145)
Total Bilirubin: 1.1 mg/dL (ref 0.3–1.2)
Total Protein: 7.4 g/dL (ref 6.5–8.1)

## 2021-12-24 LAB — LIPID PANEL
Cholesterol: 243 mg/dL — ABNORMAL HIGH (ref 0–200)
HDL: 47 mg/dL (ref >40)
LDL Cholesterol: 129 mg/dL — ABNORMAL HIGH (ref 0–99)
Total CHOL/HDL Ratio: 5.2 RATIO
Triglycerides: 336 mg/dL — ABNORMAL HIGH (ref <150)
VLDL: 67 mg/dL — ABNORMAL HIGH (ref 0–40)

## 2021-12-24 LAB — HEMOGLOBIN A1C
Hgb A1c MFr Bld: 9 % — ABNORMAL HIGH (ref 4.8–5.6)
Mean Plasma Glucose: 211.6 mg/dL

## 2021-12-25 LAB — MICROALBUMIN, URINE: Microalb, Ur: 62.2 ug/mL — ABNORMAL HIGH

## 2021-12-28 ENCOUNTER — Ambulatory Visit (HOSPITAL_COMMUNITY)
Admission: RE | Admit: 2021-12-28 | Discharge: 2021-12-28 | Disposition: A | Discharge status: Home / Self Care | Payer: Self-pay | Source: Ambulatory Visit | Attending: Physician Assistant | Admitting: Physician Assistant

## 2021-12-28 DIAGNOSIS — Z1231 Encounter for screening mammogram for malignant neoplasm of breast: Secondary | ICD-10-CM | POA: Insufficient documentation

## 2022-01-01 ENCOUNTER — Other Ambulatory Visit: Payer: Self-pay | Admitting: Physician Assistant

## 2022-01-01 ENCOUNTER — Encounter: Payer: Self-pay | Admitting: Physician Assistant

## 2022-01-01 ENCOUNTER — Ambulatory Visit: Payer: PRIVATE HEALTH INSURANCE | Admitting: Physician Assistant

## 2022-01-01 VITALS — BP 116/68 | HR 72 | Temp 97.8°F | Wt 206.5 lb

## 2022-01-01 DIAGNOSIS — H6122 Impacted cerumen, left ear: Secondary | ICD-10-CM

## 2022-01-01 DIAGNOSIS — Z789 Other specified health status: Secondary | ICD-10-CM

## 2022-01-01 DIAGNOSIS — E669 Obesity, unspecified: Secondary | ICD-10-CM

## 2022-01-01 DIAGNOSIS — E785 Hyperlipidemia, unspecified: Secondary | ICD-10-CM

## 2022-01-01 DIAGNOSIS — E1165 Type 2 diabetes mellitus with hyperglycemia: Secondary | ICD-10-CM

## 2022-01-01 MED ORDER — METFORMIN HCL 1000 MG PO TABS: 1000.0000 mg | ORAL_TABLET | Freq: Two times a day (BID) | ORAL | 1 refills | Status: DC

## 2022-01-01 MED ORDER — ATORVASTATIN CALCIUM 80 MG PO TABS: 80.0000 mg | ORAL_TABLET | Freq: Every day | ORAL | 3 refills | Status: DC

## 2022-01-01 NOTE — Progress Notes (Signed)
BP 116/68   Pulse 72   Temp 97.8 F (36.6 C)   Wt 206 lb 8 oz (93.7 kg)   SpO2 97%   BMI 36.58 kg/m    Subjective:    Patient ID: Angelica Bentley, female    DOB: [birth year], 51 y.o.   MRN: [id number]  HPI: Angelica Bentley is a 51 y.o. female presenting on 01/01/2022 for Diabetes, Hypertension, and Hyperlipidemia   HPI   Chief Complaint  Patient presents with   Diabetes   Hypertension   Hyperlipidemia    Pt says she is doing well and she has no complaints.     While reviewing her meds, it was discovered that she is taking her glipizide twice and no metformin.  Relevant past medical, surgical, family and social history reviewed and updated as indicated. Interim medical history since our last visit reviewed. Allergies and medications reviewed and updated.   Current Outpatient Medications:    atorvastatin (LIPITOR) 40 MG tablet, Take 1 tablet (40 mg total) by mouth daily. Tome una tableta por boca diaria, Disp: 90 tablet, Rfl: 0   glipiZIDE (GLUCOTROL) 10 MG tablet, Take 1 tablet (10 mg total) by mouth 2 (two) times daily before a meal. Tome una tableta por boca dos veces diarias con comida, Disp: 180 tablet, Rfl: 0   metFORMIN (GLUCOPHAGE) 1000 MG tablet, Take 1 tablet (1,000 mg total) by mouth 2 (two) times daily with a meal. Tome una tableta por boca dos veces diarias con comida (Patient not taking: Reported on 01/01/2022), Disp: 180 tablet, Rfl: 0    Review of Systems  Per HPI unless specifically indicated above     Objective:    BP 116/68   Pulse 72   Temp 97.8 F (36.6 C)   Wt 206 lb 8 oz (93.7 kg)   SpO2 97%   BMI 36.58 kg/m   Wt Readings from Last 3 Encounters:  01/01/22 206 lb 8 oz (93.7 kg)  09/25/21 208 lb (94.3 kg)  06/26/21 209 lb (94.8 kg)    Physical Exam Vitals reviewed.  Constitutional:      General: She is not in acute distress.    Appearance: She is well-developed. She is not toxic-appearing.  HENT:     Head: Normocephalic and  atraumatic.     Right Ear: Tympanic membrane, ear canal and external ear normal.     Left Ear: Tympanic membrane normal. There is impacted cerumen.  Eyes:     Extraocular Movements: Extraocular movements intact.     Conjunctiva/sclera: Conjunctivae normal.     Pupils: Pupils are equal, round, and reactive to light.  Cardiovascular:     Rate and Rhythm: Normal rate and regular rhythm.  Pulmonary:     Effort: Pulmonary effort is normal.     Breath sounds: Normal breath sounds.  Abdominal:     General: Bowel sounds are normal.     Palpations: Abdomen is soft. There is no mass.     Tenderness: There is no abdominal tenderness.  Musculoskeletal:     Cervical back: Neck supple.     Right lower leg: No edema.     Left lower leg: No edema.  Lymphadenopathy:     Cervical: No cervical adenopathy.  Skin:    General: Skin is warm and dry.  Neurological:     Mental Status: She is alert and oriented to person, place, and time.     Motor: No weakness or tremor.     Deep Tendon Reflexes:  Reflex Scores:      Patellar reflexes are 2+ on the right side and 2+ on the left side. Psychiatric:        Behavior: Behavior normal.     Results for orders placed or performed during the hospital encounter of 12/24/21  Microalbumin, urine  Result Value Ref Range   Microalb, Ur 62.2 (H) Not Estab. ug/mL  Hemoglobin A1c  Result Value Ref Range   Hgb A1c MFr Bld 9.0 (H) 4.8 - 5.6 %   Mean Plasma Glucose 211.6 mg/dL  Lipid panel  Result Value Ref Range   Cholesterol 243 (H) 0 - 200 mg/dL   Triglycerides 336 (H) <150 mg/dL   HDL 47 >40 mg/dL   Total CHOL/HDL Ratio 5.2 RATIO   VLDL 67 (H) 0 - 40 mg/dL   LDL Cholesterol 129 (H) 0 - 99 mg/dL  Comprehensive metabolic panel  Result Value Ref Range   Sodium 134 (L) 135 - 145 mmol/L   Potassium 3.7 3.5 - 5.1 mmol/L   Chloride 105 98 - 111 mmol/L   CO2 24 22 - 32 mmol/L   Glucose, Bld 288 (H) 70 - 99 mg/dL   BUN 15 6 - 20 mg/dL   Creatinine, Ser  0.43 (L) 0.44 - 1.00 mg/dL   Calcium 9.0 8.9 - 10.3 mg/dL   Total Protein 7.4 6.5 - 8.1 g/dL   Albumin 3.8 3.5 - 5.0 g/dL   AST 13 (L) 15 - 41 U/L   ALT 19 0 - 44 U/L   Alkaline Phosphatase 93 38 - 126 U/L   Total Bilirubin 1.1 0.3 - 1.2 mg/dL   GFR, Estimated >60 >60 mL/min   Anion gap 5 5 - 15      Assessment & Plan:   Encounter Diagnoses  Name Primary?   Uncontrolled type 2 diabetes mellitus with hyperglycemia (HCC) Yes   Hyperlipidemia, unspecified hyperlipidemia type    Impacted cerumen of left ear    Not proficient in English language    Obesity, unspecified classification, unspecified obesity type, unspecified whether serious comorbidity present      -Reviewed labs with pt -Increase atorvastatin and encouraged to follow lowfat diet -renewed metformin rx and pt counseled to take the glipizide 2 pills (1 bid) -encouraged regular exercise -pt to follow up 3 months.  She is to contact office sooner prn

## 2022-03-06 ENCOUNTER — Telehealth: Payer: Self-pay

## 2022-03-20 ENCOUNTER — Other Ambulatory Visit: Payer: Self-pay | Admitting: Physician Assistant

## 2022-03-20 DIAGNOSIS — E785 Hyperlipidemia, unspecified: Secondary | ICD-10-CM

## 2022-03-20 DIAGNOSIS — E1165 Type 2 diabetes mellitus with hyperglycemia: Secondary | ICD-10-CM

## 2022-04-10 ENCOUNTER — Ambulatory Visit: Payer: PRIVATE HEALTH INSURANCE | Admitting: Physician Assistant

## 2022-04-22 ENCOUNTER — Other Ambulatory Visit (HOSPITAL_COMMUNITY)
Admission: RE | Admit: 2022-04-22 | Discharge: 2022-04-22 | Disposition: A | Discharge status: Home / Self Care | Payer: Self-pay | Source: Ambulatory Visit | Attending: Physician Assistant | Admitting: Physician Assistant

## 2022-04-22 DIAGNOSIS — E1165 Type 2 diabetes mellitus with hyperglycemia: Secondary | ICD-10-CM | POA: Insufficient documentation

## 2022-04-22 DIAGNOSIS — E785 Hyperlipidemia, unspecified: Secondary | ICD-10-CM | POA: Insufficient documentation

## 2022-04-22 LAB — HEMOGLOBIN A1C
Hgb A1c MFr Bld: 9.6 % — ABNORMAL HIGH (ref 4.8–5.6)
Mean Plasma Glucose: 228.82 mg/dL

## 2022-04-22 LAB — LIPID PANEL
Cholesterol: 234 mg/dL — ABNORMAL HIGH (ref 0–200)
HDL: 46 mg/dL (ref >40)
LDL Cholesterol: 131 mg/dL — ABNORMAL HIGH (ref 0–99)
Total CHOL/HDL Ratio: 5.1 RATIO
Triglycerides: 287 mg/dL — ABNORMAL HIGH (ref <150)
VLDL: 57 mg/dL — ABNORMAL HIGH (ref 0–40)

## 2022-04-22 LAB — COMPREHENSIVE METABOLIC PANEL
ALT: 19 U/L (ref 0–44)
AST: 15 U/L (ref 15–41)
Albumin: 3.6 g/dL (ref 3.5–5.0)
Alkaline Phosphatase: 101 U/L (ref 38–126)
Anion gap: 5 (ref 5–15)
BUN: 9 mg/dL (ref 6–20)
CO2: 25 mmol/L (ref 22–32)
Calcium: 8.7 mg/dL — ABNORMAL LOW (ref 8.9–10.3)
Chloride: 103 mmol/L (ref 98–111)
Creatinine, Ser: 0.42 mg/dL — ABNORMAL LOW (ref 0.44–1.00)
GFR, Estimated: >60 mL/min (ref >60)
Glucose, Bld: 252 mg/dL — ABNORMAL HIGH (ref 70–99)
Potassium: 3.5 mmol/L (ref 3.5–5.1)
Sodium: 133 mmol/L — ABNORMAL LOW (ref 135–145)
Total Bilirubin: 1 mg/dL (ref 0.3–1.2)
Total Protein: 7.3 g/dL (ref 6.5–8.1)

## 2022-04-23 ENCOUNTER — Ambulatory Visit: Payer: PRIVATE HEALTH INSURANCE | Admitting: Physician Assistant

## 2022-04-23 ENCOUNTER — Encounter: Payer: Self-pay | Admitting: Physician Assistant

## 2022-04-23 VITALS — BP 119/67 | HR 78 | Temp 97.7°F | Wt 201.5 lb

## 2022-04-23 DIAGNOSIS — E669 Obesity, unspecified: Secondary | ICD-10-CM

## 2022-04-23 DIAGNOSIS — Z789 Other specified health status: Secondary | ICD-10-CM

## 2022-04-23 DIAGNOSIS — E785 Hyperlipidemia, unspecified: Secondary | ICD-10-CM

## 2022-04-23 DIAGNOSIS — E1165 Type 2 diabetes mellitus with hyperglycemia: Secondary | ICD-10-CM

## 2022-04-23 MED ORDER — METFORMIN HCL 1000 MG PO TABS: 1000.0000 mg | ORAL_TABLET | Freq: Two times a day (BID) | ORAL | 0 refills | Status: DC

## 2022-04-23 MED ORDER — ATORVASTATIN CALCIUM 80 MG PO TABS: 80.0000 mg | ORAL_TABLET | Freq: Every day | ORAL | 0 refills | Status: DC

## 2022-04-23 MED ORDER — GLIPIZIDE 10 MG PO TABS: 10.0000 mg | ORAL_TABLET | Freq: Two times a day (BID) | ORAL | 0 refills | Status: DC

## 2022-04-23 NOTE — Progress Notes (Signed)
BP 119/67   Pulse 78   Temp 97.7 F (36.5 C)   Wt 201 lb 8 oz (91.4 kg)   SpO2 99%   BMI 35.69 kg/m    Subjective:    Patient ID: Angelica Bentley, female    DOB: [birth year], 52 y.o.   MRN: [id number]  HPI: Angelica Bentley is a 52 y.o. female presenting on 04/23/2022 for Diabetes and Hyperlipidemia   HPI  Chief Complaint  Patient presents with   Diabetes   Hyperlipidemia     Pt is not taking her glipizide metformin atorvastatin as prescribed.    She has no complaints today.     Relevant past medical, surgical, family and social history reviewed and updated as indicated. Interim medical history since our last visit reviewed. Allergies and medications reviewed and updated.    Current Outpatient Medications:    atorvastatin (LIPITOR) 80 MG tablet, Take 1 tablet (80 mg total) by mouth daily. Tome una tableta por boca diaria, Disp: 90 tablet, Rfl: 3   metFORMIN (GLUCOPHAGE) 1000 MG tablet, Take 1 tablet (1,000 mg total) by mouth 2 (two) times daily with a meal. Tome una tableta por boca dos veces diarias con comida, Disp: 180 tablet, Rfl: 1   glipiZIDE (GLUCOTROL) 10 MG tablet, Take 1 tablet (10 mg total) by mouth 2 (two) times daily before a meal. Tome una tableta por boca dos veces diarias con comida (Patient not taking: Reported on 04/23/2022), Disp: 180 tablet, Rfl: 0     Review of Systems  Per HPI unless specifically indicated above     Objective:    BP 119/67   Pulse 78   Temp 97.7 F (36.5 C)   Wt 201 lb 8 oz (91.4 kg)   SpO2 99%   BMI 35.69 kg/m   Wt Readings from Last 3 Encounters:  04/23/22 201 lb 8 oz (91.4 kg)  01/01/22 206 lb 8 oz (93.7 kg)  09/25/21 208 lb (94.3 kg)    Physical Exam Vitals reviewed.  Constitutional:      General: She is not in acute distress.    Appearance: She is well-developed. She is not toxic-appearing.  HENT:     Head: Normocephalic and atraumatic.  Cardiovascular:     Rate and Rhythm: Normal rate and  regular rhythm.  Pulmonary:     Effort: Pulmonary effort is normal.     Breath sounds: Normal breath sounds.  Abdominal:     General: Bowel sounds are normal.     Palpations: Abdomen is soft. There is no mass.     Tenderness: There is no abdominal tenderness.  Musculoskeletal:     Cervical back: Neck supple.     Right lower leg: No edema.     Left lower leg: No edema.  Lymphadenopathy:     Cervical: No cervical adenopathy.  Skin:    General: Skin is warm and dry.  Neurological:     Mental Status: She is alert and oriented to person, place, and time.  Psychiatric:        Behavior: Behavior normal.     Results for orders placed or performed during the hospital encounter of 04/22/22  Comprehensive metabolic panel  Result Value Ref Range   Sodium 133 (L) 135 - 145 mmol/L   Potassium 3.5 3.5 - 5.1 mmol/L   Chloride 103 98 - 111 mmol/L   CO2 25 22 - 32 mmol/L   Glucose, Bld 252 (H) 70 - 99 mg/dL   BUN 9 6 -  20 mg/dL   Creatinine, Ser 0.42 (L) 0.44 - 1.00 mg/dL   Calcium 8.7 (L) 8.9 - 10.3 mg/dL   Total Protein 7.3 6.5 - 8.1 g/dL   Albumin 3.6 3.5 - 5.0 g/dL   AST 15 15 - 41 U/L   ALT 19 0 - 44 U/L   Alkaline Phosphatase 101 38 - 126 U/L   Total Bilirubin 1.0 0.3 - 1.2 mg/dL   GFR, Estimated >60 >60 mL/min   Anion gap 5 5 - 15  Lipid panel  Result Value Ref Range   Cholesterol 234 (H) 0 - 200 mg/dL   Triglycerides 287 (H) <150 mg/dL   HDL 46 >40 mg/dL   Total CHOL/HDL Ratio 5.1 RATIO   VLDL 57 (H) 0 - 40 mg/dL   LDL Cholesterol 131 (H) 0 - 99 mg/dL  Hemoglobin A1c  Result Value Ref Range   Hgb A1c MFr Bld 9.6 (H) 4.8 - 5.6 %   Mean Plasma Glucose 228.82 mg/dL      Assessment & Plan:    Encounter Diagnoses  Name Primary?   Uncontrolled type 2 diabetes mellitus with hyperglycemia (HCC) Yes   Hyperlipidemia, unspecified hyperlipidemia type    Not proficient in English language    Obesity, unspecified classification, unspecified obesity type, unspecified whether  serious comorbidity present      -reviewed labs with pt -discussed with pt risks of uncontrolled dm and dyslipidemia which is why we try to get those controlled.  She states understanding.  -meds refilled -pt will follow up 3 months. She is to contact office sooner prn

## 2022-06-05 ENCOUNTER — Telehealth: Payer: Self-pay

## 2022-06-05 NOTE — Telephone Encounter (Signed)
Attempted to call for follow up of Care connect client with interpreter services. No answer, left message.   [place] [employer]

## 2022-07-04 ENCOUNTER — Other Ambulatory Visit: Payer: Self-pay | Admitting: Physician Assistant

## 2022-07-04 DIAGNOSIS — E1165 Type 2 diabetes mellitus with hyperglycemia: Secondary | ICD-10-CM

## 2022-07-04 DIAGNOSIS — E785 Hyperlipidemia, unspecified: Secondary | ICD-10-CM

## 2022-07-22 ENCOUNTER — Other Ambulatory Visit (HOSPITAL_COMMUNITY)
Admission: RE | Admit: 2022-07-22 | Discharge: 2022-07-22 | Disposition: A | Discharge status: Home / Self Care | Payer: Self-pay | Source: Ambulatory Visit | Attending: Physician Assistant | Admitting: Physician Assistant

## 2022-07-22 DIAGNOSIS — E1165 Type 2 diabetes mellitus with hyperglycemia: Secondary | ICD-10-CM | POA: Insufficient documentation

## 2022-07-22 DIAGNOSIS — E785 Hyperlipidemia, unspecified: Secondary | ICD-10-CM | POA: Insufficient documentation

## 2022-07-22 LAB — LIPID PANEL
Cholesterol: 217 mg/dL — ABNORMAL HIGH (ref 0–200)
HDL: 48 mg/dL (ref >40)
LDL Cholesterol: 132 mg/dL — ABNORMAL HIGH (ref 0–99)
Total CHOL/HDL Ratio: 4.5 RATIO
Triglycerides: 186 mg/dL — ABNORMAL HIGH (ref <150)
VLDL: 37 mg/dL (ref 0–40)

## 2022-07-22 LAB — COMPREHENSIVE METABOLIC PANEL
ALT: 17 U/L (ref 0–44)
AST: 11 U/L — ABNORMAL LOW (ref 15–41)
Albumin: 3.6 g/dL (ref 3.5–5.0)
Alkaline Phosphatase: 88 U/L (ref 38–126)
Anion gap: 9 (ref 5–15)
BUN: 12 mg/dL (ref 6–20)
CO2: 25 mmol/L (ref 22–32)
Calcium: 9 mg/dL (ref 8.9–10.3)
Chloride: 100 mmol/L (ref 98–111)
Creatinine, Ser: 0.5 mg/dL (ref 0.44–1.00)
GFR, Estimated: >60 mL/min (ref >60)
Glucose, Bld: 264 mg/dL — ABNORMAL HIGH (ref 70–99)
Potassium: 4 mmol/L (ref 3.5–5.1)
Sodium: 134 mmol/L — ABNORMAL LOW (ref 135–145)
Total Bilirubin: 0.9 mg/dL (ref 0.3–1.2)
Total Protein: 7.2 g/dL (ref 6.5–8.1)

## 2022-07-22 LAB — HEMOGLOBIN A1C
Hgb A1c MFr Bld: 8.8 % — ABNORMAL HIGH (ref 4.8–5.6)
Mean Plasma Glucose: 205.86 mg/dL

## 2022-07-23 ENCOUNTER — Ambulatory Visit: Payer: PRIVATE HEALTH INSURANCE | Admitting: Physician Assistant

## 2022-07-23 ENCOUNTER — Encounter: Payer: Self-pay | Admitting: Physician Assistant

## 2022-07-23 VITALS — BP 128/72 | HR 80 | Temp 97.4°F | Wt 203.0 lb

## 2022-07-23 DIAGNOSIS — E1165 Type 2 diabetes mellitus with hyperglycemia: Secondary | ICD-10-CM

## 2022-07-23 DIAGNOSIS — E785 Hyperlipidemia, unspecified: Secondary | ICD-10-CM

## 2022-07-23 DIAGNOSIS — Z789 Other specified health status: Secondary | ICD-10-CM

## 2022-07-23 MED ORDER — METFORMIN HCL 1000 MG PO TABS: 1000.0000 mg | ORAL_TABLET | Freq: Two times a day (BID) | ORAL | 0 refills | Status: AC

## 2022-07-23 MED ORDER — SITAGLIPTIN PHOSPHATE 100 MG PO TABS: 100.0000 mg | ORAL_TABLET | Freq: Every day | ORAL | 0 refills | Status: AC

## 2022-07-23 MED ORDER — ATORVASTATIN CALCIUM 80 MG PO TABS: 80.0000 mg | ORAL_TABLET | Freq: Every day | ORAL | 0 refills | Status: AC

## 2022-07-23 MED ORDER — EZETIMIBE 10 MG PO TABS: 10.0000 mg | ORAL_TABLET | Freq: Every day | ORAL | 1 refills | Status: AC

## 2022-07-23 NOTE — Progress Notes (Signed)
BP 128/72   Pulse 80   Temp (!) 97.4 F (36.3 C)   Wt 203 lb (92.1 kg)   SpO2 99%   BMI 35.96 kg/m    Subjective:    Patient ID: Angelica Bentley, female    DOB: July 11, 1970, 52 y.o.   MRN: [id number]  HPI: Angelica Bentley is a 52 y.o. female presenting on 07/23/2022 for Diabetes and Hyperlipidemia   HPI  Chief Complaint  Patient presents with   Diabetes   Hyperlipidemia    Pt says she is doing well and has no complaints.   Relevant past medical, surgical, family and social history reviewed and updated as indicated. Interim medical history since our last visit reviewed. Allergies and medications reviewed and updated.   Current Outpatient Medications:    atorvastatin (LIPITOR) 80 MG tablet, Take 1 tablet (80 mg total) by mouth daily. Tome una tableta por boca diaria, Disp: 90 tablet, Rfl: 0   glipiZIDE (GLUCOTROL) 10 MG tablet, Take 1 tablet (10 mg total) by mouth 2 (two) times daily before a meal. Tome una tableta por boca dos veces diarias con comida, Disp: 180 tablet, Rfl: 0   metFORMIN (GLUCOPHAGE) 1000 MG tablet, Take 1 tablet (1,000 mg total) by mouth 2 (two) times daily with a meal. Tome una tableta por boca dos veces diarias con comida, Disp: 180 tablet, Rfl: 0     Review of Systems  Per HPI unless specifically indicated above     Objective:    BP 128/72   Pulse 80   Temp (!) 97.4 F (36.3 C)   Wt 203 lb (92.1 kg)   SpO2 99%   BMI 35.96 kg/m   Wt Readings from Last 3 Encounters:  07/23/22 203 lb (92.1 kg)  04/23/22 201 lb 8 oz (91.4 kg)  01/01/22 206 lb 8 oz (93.7 kg)    Physical Exam Vitals reviewed.  Constitutional:      General: She is not in acute distress.    Appearance: She is well-developed. She is not toxic-appearing.  HENT:     Head: Normocephalic and atraumatic.  Cardiovascular:     Rate and Rhythm: Normal rate and regular rhythm.  Pulmonary:     Effort: Pulmonary effort is normal.     Breath sounds: Normal breath sounds.   Abdominal:     General: Bowel sounds are normal.     Palpations: Abdomen is soft. There is no mass.     Tenderness: There is no abdominal tenderness.  Musculoskeletal:     Cervical back: Neck supple.     Right lower leg: No edema.     Left lower leg: No edema.  Lymphadenopathy:     Cervical: No cervical adenopathy.  Skin:    General: Skin is warm and dry.  Neurological:     Mental Status: She is alert and oriented to person, place, and time.  Psychiatric:        Behavior: Behavior normal.     Results for orders placed or performed during the hospital encounter of 07/22/22  Lipid panel  Result Value Ref Range   Cholesterol 217 (H) 0 - 200 mg/dL   Triglycerides 409 (H) <150 mg/dL   HDL 48 >81 mg/dL   Total CHOL/HDL Ratio 4.5 RATIO   VLDL 37 0 - 40 mg/dL   LDL Cholesterol 191 (H) 0 - 99 mg/dL  Comprehensive metabolic panel  Result Value Ref Range   Sodium 134 (L) 135 - 145 mmol/L   Potassium 4.0 3.5 -  5.1 mmol/L   Chloride 100 98 - 111 mmol/L   CO2 25 22 - 32 mmol/L   Glucose, Bld 264 (H) 70 - 99 mg/dL   BUN 12 6 - 20 mg/dL   Creatinine, Ser 1.61 0.44 - 1.00 mg/dL   Calcium 9.0 8.9 - 09.6 mg/dL   Total Protein 7.2 6.5 - 8.1 g/dL   Albumin 3.6 3.5 - 5.0 g/dL   AST 11 (L) 15 - 41 U/L   ALT 17 0 - 44 U/L   Alkaline Phosphatase 88 38 - 126 U/L   Total Bilirubin 0.9 0.3 - 1.2 mg/dL   GFR, Estimated >04 >54 mL/min   Anion gap 9 5 - 15  Hemoglobin A1c  Result Value Ref Range   Hgb A1c MFr Bld 8.8 (H) 4.8 - 5.6 %   Mean Plasma Glucose 205.86 mg/dL      Assessment & Plan:     Encounter Diagnoses  Name Primary?   Uncontrolled type 2 diabetes mellitus with hyperglycemia Yes   Hyperlipidemia, unspecified hyperlipidemia type    Not proficient in [place] language      Reviewed labs with pt  Change meds-   Atorvastatin Metformin Zetia Januvia  She is to discontinue wth glipizide when she starts the [place]  Pt encouraged to UPdate enrollment with [facility]  and [facility]  Pt to follow up 3 months.  She is to contact office sooner prn

## 2022-10-07 ENCOUNTER — Other Ambulatory Visit: Payer: Self-pay | Admitting: Physician Assistant

## 2022-10-07 DIAGNOSIS — E1165 Type 2 diabetes mellitus with hyperglycemia: Secondary | ICD-10-CM

## 2022-10-07 DIAGNOSIS — E785 Hyperlipidemia, unspecified: Secondary | ICD-10-CM

## 2022-10-22 ENCOUNTER — Ambulatory Visit: Payer: PRIVATE HEALTH INSURANCE | Admitting: Physician Assistant

## 2022-11-05 ENCOUNTER — Ambulatory Visit: Payer: PRIVATE HEALTH INSURANCE | Admitting: Physician Assistant

## 2022-11-08 ENCOUNTER — Telehealth: Payer: Self-pay

## 2022-11-08 NOTE — Telephone Encounter (Signed)
Attempted follow up call with [employer] Marquita Palms ID # [phone], Client had expired with [facility] 08/06/22 and would need to renew.  [facility] and her last appointment was 07/23/22 and no future appointments. She is type 2 Diabetic last A1C 8.8 07/22/22 Client states she had been without her car and that is why she has not renewed, but she is able to drive now. Provided her with [facility] number as she states she will call today or Monday to discuss renewal.  Client reports she has all of her medications as we discussed her [facility] had also expired and she reports she has all of her medications.   Plan: will continue to follow and monitor for appointment to renew [facility] and other services needed.   Francee Nodal RN [employer]

## 2023-04-16 ENCOUNTER — Telehealth: Payer: Self-pay

## 2023-04-16 NOTE — Telephone Encounter (Signed)
 Attempted call with interpreter services. No answer. [facility] ended in May and has not renewed. Last seen at P[facility] in 07/23/22.  Will continue to attempt to reach, renewal letter had already been sent.   Kris Pester RN [employer]

## 2023-06-26 ENCOUNTER — Telehealth: Payer: Self-pay

## 2023-06-26 NOTE — Telephone Encounter (Signed)
 Attempted follow up of [facility] client who's enrollment expired. She was being seen at [facility] but was last seen 07/23/22. She is a diabetic. No pending appointments showing. [employer] service utilized today, no answer. Left message requesting return call.    Francee Nodal RN [employer]

## 2023-08-04 ENCOUNTER — Telehealth: Payer: Self-pay

## 2023-08-04 NOTE — Telephone Encounter (Signed)
 Attempted call to follow up with [facility] client. No answer, interpreter services utilized. No answer left message.  [facility] has expired. She has no appointments scheduled with [facility]. Last seen 07/23/22   Will mail renewal letter and also Case manager's letter to Diabetic clients.   Kris Pester RN [employer]

## 2023-08-14 ENCOUNTER — Telehealth: Payer: Self-pay

## 2023-08-14 NOTE — Telephone Encounter (Signed)
 Attempted follow up by phone with interpreter, no answer. Client has expired with [facility] on 08/06/22 and has not been seen at [facility] since 2024. She is diabetic. Left message requesting return call so we may help her with accessing medical care if needed.   Kris Pester RN [employer]
